# Patient Record
Sex: Female | Born: 1950
Health system: Southern US, Community
[De-identification: ages and names within clinical notes are randomized; demographics above are authoritative.]

## PROBLEM LIST (undated history)

## (undated) DIAGNOSIS — M1990 Unspecified osteoarthritis, unspecified site: Secondary | ICD-10-CM

## (undated) DIAGNOSIS — K219 Gastro-esophageal reflux disease without esophagitis: Secondary | ICD-10-CM

## (undated) DIAGNOSIS — I10 Essential (primary) hypertension: Secondary | ICD-10-CM

## (undated) DIAGNOSIS — T4145XA Adverse effect of unspecified anesthetic, initial encounter: Secondary | ICD-10-CM

## (undated) DIAGNOSIS — T8859XA Other complications of anesthesia, initial encounter: Secondary | ICD-10-CM

## (undated) DIAGNOSIS — K759 Inflammatory liver disease, unspecified: Secondary | ICD-10-CM

## (undated) HISTORY — PX: TUBAL LIGATION: SHX77

## (undated) HISTORY — PX: CHOLECYSTECTOMY: SHX55

---

## 2002-01-30 ENCOUNTER — Ambulatory Visit (HOSPITAL_COMMUNITY): Admission: RE | Admit: 2002-01-30 | Discharge: 2002-01-30 | Payer: Self-pay | Admitting: Internal Medicine

## 2002-03-27 ENCOUNTER — Ambulatory Visit (HOSPITAL_COMMUNITY): Admission: RE | Admit: 2002-03-27 | Discharge: 2002-03-27 | Payer: Self-pay | Admitting: Internal Medicine

## 2002-03-27 HISTORY — PX: COLONOSCOPY: SHX174

## 2008-03-06 ENCOUNTER — Ambulatory Visit: Payer: Self-pay | Admitting: Internal Medicine

## 2008-03-06 LAB — CONVERTED CEMR LAB
ALT: 22 units/L (ref 0–35)
Amylase: 74 units/L (ref 0–105)
CO2: 26 meq/L (ref 19–32)
Chloride: 103 meq/L (ref 96–112)
Hemoglobin: 12.7 g/dL (ref 12.0–15.0)
Lymphs Abs: 1.2 10*3/uL (ref 0.7–4.0)
Monocytes Relative: 9 % (ref 3–12)
Neutro Abs: 5.4 10*3/uL (ref 1.7–7.7)
Neutrophils Relative %: 73 % (ref 43–77)
RBC: 4.28 M/uL (ref 3.87–5.11)
Sodium: 141 meq/L (ref 135–145)
Tissue Transglutaminase Ab, IgA: 0 units (ref <7)
Total Bilirubin: 0.4 mg/dL (ref 0.3–1.2)
Total Protein: 7.2 g/dL (ref 6.0–8.3)
WBC: 7.4 10*3/uL (ref 4.0–10.5)

## 2008-03-29 ENCOUNTER — Telehealth (INDEPENDENT_AMBULATORY_CARE_PROVIDER_SITE_OTHER): Payer: Self-pay

## 2008-04-05 ENCOUNTER — Encounter: Payer: Self-pay | Admitting: Gastroenterology

## 2008-08-09 DIAGNOSIS — I10 Essential (primary) hypertension: Secondary | ICD-10-CM | POA: Insufficient documentation

## 2008-08-09 DIAGNOSIS — K3189 Other diseases of stomach and duodenum: Secondary | ICD-10-CM

## 2008-08-09 DIAGNOSIS — R1013 Epigastric pain: Secondary | ICD-10-CM

## 2008-08-09 DIAGNOSIS — M129 Arthropathy, unspecified: Secondary | ICD-10-CM | POA: Insufficient documentation

## 2010-05-20 NOTE — Assessment & Plan Note (Signed)
NAME:  Lisa French, Lisa French              CHART#:  [id number]   DATE:  03/06/2008                       DOB:  September 23, 1950   CHIEF COMPLAINT:  Indigestion.   HISTORY OF PRESENT ILLNESS:  The patient is a pleasant 60 year old  Caucasian female from [place] followed primarily by Dr. Donzetta Sprung over  there, who is back to see me with ongoing symptoms of intermittent left  upper quadrant abdominal pain, progressive bloating during the day,  intermittent bouts of diarrhea and constipation.  She describes  indigestion rubbing her periumbilical area from time to time.  She  states she is sore in this area on a nearly daily basis.  She has had  these symptoms as long as she can remember, specifies at least 10 years.  She has not had any associated nausea, vomiting, melena or hematochezia.  She has actually gained about 7 pounds since we last saw her in 2004.  She states that she has a lactose intolerance and has diarrhea and  bloating if she consumes lactose-containing foods.  However, about 50%  of the time she is constipated.  These symptoms have been ongoing for  many years.  I saw this nice lady for similar symptoms back in 2004, at  which time she underwent an EGD which revealed a normal upper GI tract.  Ultrasound previously revealed a normal gallbladder.  A HIDA with a CCK  demonstrated a gallbladder EF of 63%.  She tells me that since that time  Dr. Reuel Boom has done an EGD and it has been over 2 years ago, however,  and he found no abnormalities.  She has been on Protonix and another  acid-suppressing agents over the years, without much improvement.  She  currently is taking ranitidine 150 mg orally b.i.d.  She has also been  on a probiotic.  She really denies any reflux symptoms, odynophagia or  dysphagia.  There is no family history of GI neoplasia.  She is not  taking any nonsteroidal agents.   PAST MEDICAL HISTORY:  Significant for hypercholesterolemia, high blood  pressure.   PAST  SURGICAL HISTORY:  Tubal ligation, EGD, colonoscopy.   CURRENT MEDICATIONS:  Ranitidine 150 mg orally b.i.d., probiotic daily,  lisinopril 10 mg daily, lovastatin 20 mg orally daily.   ALLERGIES:  No known drug allergies.   FAMILY HISTORY:  Mother is age 55, alive and in good health.  Father  passed away at age 58 with lung cancer.  One sister with breast cancer.   SOCIAL HISTORY:  The patient is married.  She works in Engineer, petroleum.  No  tobacco.  No alcohol.  No illicit drugs.   REVIEW OF SYSTEMS:  As in history of present illness.  No chest pain, no  dyspnea on exertion.  No fever, chills, night sweats.   PHYSICAL EXAMINATION:  A 57-year lady resting comfortably.  Weight 153,  height 5 feet 5 inches.  Temperature 97.0, BP 104/76, pulse 60.  SKIN:  Warm and dry.  HEENT EXAM:  No scleral icterus.  Conjunctivae are pink.  CHEST:  Lungs are clear to auscultation.  CARDIAC:  Regular rate and rhythm without murmur, gallop or rub.  BREAST EXAM:  Deferred.  ABDOMEN:  Nondistended, positive bowel sounds, soft.  She does have some  localized periumbilical tenderness without mass or organomegaly.  I do  not appreciate a hernia.  EXTREMITIES:  No edema.  RECTAL EXAM:  Good sphincter tone.  Scant brown stool in the rectal  vault.  No mass in the rectal vault.  Hemoccult negative.   IMPRESSION:  The patient is a very pleasant 60 year old lady with  chronic vague left upper quadrant abdominal pain, intermittent vague  periumbilical discomfort, daily abdominal bloating.  She has alternating  constipation and diarrhea.   She has not been helped with a probiotic or acid suppression therapy  previously.  She has had a negative upper gastrointestinal endoscopic  evaluation on at least 2 occasions, negative colonoscopy in 2004.   I suspect the patient has an element of irritable bowel syndrome but all  of her symptoms are not typical with that entity.  Diagnostic  considerations at this point  would include bacterial overgrowth, celiac  disease, non-ulcer dyspepsia or smoldering low-grade pancreatitis.  I  doubt her symptoms are emanating from her gallbladder.   RECOMMENDATIONS:  1. I have asked her just for the time being to stop ranitidine.  I      will give her a 3-week course of Aciphex 20 mg orally daily just to      see if this makes a difference.  I have given her samples through      the office.  2. Will check a serum amylase, lipase, LFTs and a CBC as well as a      celiac antibody panel and then will plan to see this nice lady back      in the office in 1 month.  I told her that she may need further      evaluation.  She may benefit from an empiric trial of antibiotics      versus proceeding with a hydrogen breath test.  Depending on the      results of the above-mentioned studies, will make further      recommendations in the very near future.       Jonathon Bellows, M.D.  Electronically Signed     RMR/MEDQ  D:  03/06/2008  T:  03/06/2008  Job:  [id number]   cc:   Donzetta Sprung

## 2010-05-23 NOTE — H&P (Signed)
NAME:  Lisa French, Lisa French                           ACCOUNT NO.:  [id number]   MEDICAL RECORD NO.:  [id number]                  PATIENT TYPE:   LOCATION:                                       FACILITY:   PHYSICIAN:  R. Roetta Sessions, M.D.              DATE OF BIRTH:  10-Dec-1950   DATE OF ADMISSION:  DATE OF DISCHARGE:                                HISTORY & PHYSICAL   CHIEF COMPLAINT:  Medial left upper quadrant abdominal pain, some reflux  symptoms, need for colorectal cancer screening.   The patient is a 60 year old lady with left upper abdominal pain and some  reflux symptoms not significantly improved with Protonix  40 mg orally daily as well as Lodine.  Prior gallbladder workup including  ultrasound and HIDA scan were both negative.  EGD back on 01/30/02 at [facility] also revealed a normal upper GI tract.  She had not had any  rectal bleeding.  She has a tendency towards to constipation.  She has  unrelenting medial left upper quadrant abdominal  pain.  She has had  postprandial cramps and diarrhea after eating out in restaurants, but  generally speaking is constipated.  She cannot tell whether Protonix is  really  making much difference with the reflux symptoms or not.  Her liver  profile is normal except for slightly elevated alkaline phosphatase.  Amylase was normal.  She has been on Prevacid and Prilosec previously for  her upper abdominal  symptoms without much improvement.  She has had these  symptoms for two years. Again, eating and having a bowel movement does not  alter these symptoms.  Sometimes if she takes her hands and presses her fist  into her subxiphoid area she can get a little relief.  There is not really a  positional component.   PAST MEDICAL HISTORY:  Arthritis, hypertension.   PAST SURGICAL HISTORY:  None aside from EGD.   CURRENT MEDICATIONS:  1. Lodine 400 mg b.i.d.  2. Lisinopril 10 mg daily.  3. Protonix 40 mg orally daily.   ALLERGIES:  No known drug allergies.   FAMILY/SOCIAL HISTORY:  Seen my 01/18/02 note for more information.   REVIEW OF SYSTEMS:  As in history of present illness.  She is down two  pounds from 146 to 144 since 01/19/02.   PHYSICAL EXAMINATION:  GENERAL:  She appears her baseline.  She appears  comfortable.  VITAL SIGNS:  Weight 144, blood pressure 100/70, pulse 68.  SKIN:  Warm and dry.  No jaundice.  CHEST:  Lungs are clear to auscultation.  CARDIAC:  Regular rate and rhythm without murmurs, gallops or rub.  BREASTS:  Deferred.  ABDOMEN:  Nondistended.  Positive bowel sounds.  Soft.  She is interestingly  not tender to deep palpation medial left upper quadrant.  Her left costal  margin is also not tender to palpation.  I do not appreciate a  mass or  organomegaly.  RECTAL:  Deferred until time of colonoscopy.   IMPRESSION:  The patient is a pleasant 60 year old lady with medial left  upper  quadrant abdominal  pain and occasional reflux symptoms/regurgitation  without any significant improvement in any of the above mentioned symptoms  with three different PPIs  over time.  This brings to question to how much,  if any, reflux is contributing to her symptoms.  One would expect clinical  response to nonsteroidal agents if this was musculoskeletal in origin.   The etiology of her symptoms remains poorly defined.   She needs to have a screening colonoscopy.  We will go ahead and do this as  we are in the midst of her GI evaluation.  Discussed colonoscopy with the  patient.  Potential risks, benefits, alternatives and her questions have  been answered.  She is agreeable and will perform colonoscopy in the near  future.  In the interim, I have asked her to bump up her Protonix to 40 mg  orally b.i.d.  Will see how she is doing the day of colonoscopy.  Will give  some consideration for esophageal manometry and pH monitoring while on acid  suppression therapy.  Further recommendations to  follow.                                               Jonathon Bellows, M.D.    RMR/MEDQ  D:  03/02/2002  T:  03/02/2002  Job:  [id number]   cc:   Donzetta Sprung  [address], [address]  [place]  [place] [address]  Fax: [phone]

## 2010-05-23 NOTE — H&P (Signed)
NAME:  Lisa French, Lisa French                         ACCOUNT NO.:  [id number]   MEDICAL RECORD NO.:  [id number]                  PATIENT TYPE:   LOCATION:                                       FACILITY:   PHYSICIAN:  R. Roetta Sessions, M.D.              DATE OF BIRTH:  10-26-1950   DATE OF ADMISSION:  01/19/2002  DATE OF DISCHARGE:                                HISTORY & PHYSICAL   CHIEF COMPLAINT:  Indigestion.   HISTORY OF PRESENT ILLNESS:  The patient is a 60 year old lady followed  primarily by Donzetta Sprung, M.D.  She came to see me on her own to further  evaluate a two or possibly more year history of what she describes as  indigestion.  She describes epigastric and medial left upper quadrant  abdominal discomfort sometimes after she eats.  At other times she feels  like this discomfort comes up into her lower retrosternal area.  She really  denies typical heartburn symptoms.  She has not had any associated  odynophagia or dysphagia.  When she eats a rather large meals, sometimes she  has lower abdominal cramps and diarrhea.  She also belches frequently.  She  tells me that Donzetta Sprung, M.D., performed an EGD two years ago and was  told she only had a hiatal hernia.  She has been on a variety of acid-  suppressing agents, including Prilosec and Prevacid and more recently  Protonix 40 mg daily.  She has been on one of these almost without  interruption over the past one year and really does not feel that any of  these agents significantly changes her symptoms.  She had previously been on  Celebrex, but because of insurance reasons was switched to Lodine, which she  currently takes.  She tells me that the above-mentioned symptoms predate the  use of nonsteroidal medications.  She has not had any melena or rectal  bleeding.  She has not had any weight loss.  No early satiety.  No nausea or  vomiting.  She has had at least a couple of gallbladder ultrasounds which  revealed no right  upper quadrant abnormalities.  The CBC was normal.  LFTs  okay, except for slightly elevated alkaline phosphatase of 115.  The amylase  and lipase have been normal.  She gets frustrated and wants something done  about her symptoms.  Her symptoms may last for several minutes to a couple  of hours.  There is nothing she can really do to abort them.   PAST MEDICAL HISTORY:  1. Arthritis in the left hip.  2. History of hypertension.   PAST SURGICAL HISTORY:  None.   CURRENT MEDICATIONS:  1. Lodine 400 mg orally b.i.d.  2. Protonix 40 mg daily.  3. Lisinopril 10 mg daily   FAMILY HISTORY:  Negative for chronic GI or liver disease.  Her father  succumbed to lung cancer.   SOCIAL  HISTORY:  The patient has been married for 26 years.  She has three  children.  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  No  tobacco or alcohol.   REVIEW OF SYSTEMS:  No chest pain.  No dyspnea.  No fever or chills.  No  change in weight.   PHYSICAL EXAMINATION:  GENERAL APPEARANCE:  A pleasant 60 year old lady  resting comfortably.  WEIGHT:  146 pounds.  HEIGHT:  5 feet 5 inches.  VITAL SIGNS:  Temperature 97.7 degrees, BP 100/60, pulse 76.  SKIN:  Warm and dry.  No jaundice.  No stigmata of chronic liver disease.  HEENT:  No scleral icterus.  Conjunctivae pink.  Oral cavity with no  lesions.  NECK:  JVD is not prominent.  CHEST:  The lungs are clear to auscultation.  CARDIAC:  Regular rate and rhythm without murmur, rub, or gallop.  BREASTS:  Exam is deferred.  ABDOMEN:  Nondistended.  Positive bowel sounds.  Minimal medial left upper  quadrant tenderness to palpation.  No appreciable mass or organomegaly.  EXTREMITIES:  No edema.   IMPRESSION:  The patient is a 60 year old lady with a least a two-year  history of intermittent epigastric and medial left upper quadrant abdominal  pain with associated belching.  Her symptoms do not sound classic for  gastrointestinal reflux and she  has really not had any meaningful response  to a variety of proton pump inhibitors.   Reportedly a gallbladder ultrasound was negative previously.   She has been taking nonsteroidals, although by her report her symptoms  predate the use of nonsteroid agents.  She reportedly had a normal EGD  (aside from a hiatal hernia) two years ago.   Her symptoms follow the background of dyspepsia.  She could have an NSAID-  induced gastropathy.  She could have biliary diskinesia as well contributing  to her symptoms.   RECOMMENDATIONS:  Because she has been taking nonsteroidals for some time, I  think it would be worthwhile to take another look at her upper GI tract.  To  this extent, I have offered the patient a repeat EGD.  We have talked about  the potential risks and alternatives.  She is agreeable.  We will plan to  perform an EGD in the near future.  Depending on those findings, might  consider a HIDA with fatty meal challenge.   As a totally separate issue, I talked to the patient about the importance of  colorectal cancer screening.  She has never had a colonoscopy.  She ought to  consider to having a colonoscopy sometime in the near future.   Further recommendations to follow.                                               Jonathon Bellows, M.D.    RMR/MEDQ  D:  01/19/2002  T:  01/19/2002  Job:  [id number]   cc:   Donzetta Sprung  [address]  [place]  [place] [address]  Fax: [phone]

## 2010-05-23 NOTE — Op Note (Signed)
NAME:  Lisa French, Lisa French                       ACCOUNT NO.:  [id number]   MEDICAL RECORD NO.:  [id number]                   PATIENT TYPE:  AMB   LOCATION:  DAY                                  FACILITY:   PHYSICIAN:  R. Roetta Sessions, M.D.              DATE OF BIRTH:  1950/02/10   DATE OF PROCEDURE:  03/27/2002  DATE OF DISCHARGE:                                 OPERATIVE REPORT   PROCEDURE:  Screening/diagnostic colonoscopy.   INDICATIONS:  The patient is a 60 year old lady who comes for colorectal  cancer screening.  She has had some refractory reflux symptoms now finally  controlled with Protonix 40 mg b.i.d.  Also has some medial left upper  quadrant abdominal pain the etiology of which has not been well defined.  Colonoscopy is now to be done.  This procedure has been discussed with the  patient at length.  The potential risks, benefits and alternatives have been  reviewed. Questions answered.  Please see my March 02, 2002 H&P for more  information.   DESCRIPTION OF PROCEDURE:  Oxygen saturation, blood pressure, pulse and  respiration were monitored throughout the entire procedure.  Conscious  sedation with Versed 5 mg IV and Demerol 50 mg IV in divided doses.  The  instrument was the Olympus videochip colonoscope.   FINDINGS:  Digital rectal exam revealed no abnormalities.   ENDOSCOPIC FINDINGS:  Prep was good.   Rectum:  Examination of the rectal mucosa including retroflex view of the  anal verge revealed only internal hemorrhoids.   Colon:  Colonic mucosa was surveyed from the rectosigmoid junction to the  left, transverse,  right colon to the area of the appendiceal orifice,  ileocecal valve and cecum. These structures were well seen and photographed  for the record.  Colonic mucosa all the way to the cecum appeared normal.  The terminal ileum was intubated at 10 cm and this segment of the GI tract  also appeared normal.  From this level the scope was slowly  withdrawn.  All  previously mentioned mucosal surfaces were again seen and no other  abnormalities were observed.  The patient tolerated the procedure well, was  retracted in endoscopy.   IMPRESSION:  1. Internal hemorrhoids; otherwise normal rectum.  2. Normal colon and terminal ileum.   RECOMMENDATIONS:  1. Routine colonoscopy in 10 years.  2. Continue Protonix 40 mg b.i.d.  3. Office visit in one month.  4.     I am concerned that Lodine may somehow be contributing to some of her     abdominal pain.  If her abdominal is not much improved when she comes     back to see me in one month, then we will consider switching out to     another NSAID, perhaps Coxib and pursuing further diagnostic studies.  Jonathon Bellows, M.D.    RMR/MEDQ  D:  03/27/2002  T:  03/27/2002  Job:  [id number]   cc:   Donzetta Sprung  [address]  [place]  [place] [address]  Fax: [phone]

## 2010-05-23 NOTE — Op Note (Signed)
NAME:  Lisa French, Lisa French                       ACCOUNT NO.:  [id number]   MEDICAL RECORD NO.:  [id number]                   PATIENT TYPE:  AMB   LOCATION:  DAY                                  FACILITY:  [facility]   PHYSICIAN:  R. Roetta Sessions, M.D.              DATE OF BIRTH:  07-29-1950   DATE OF PROCEDURE:  01/30/2002  DATE OF DISCHARGE:                                 OPERATIVE REPORT   PROCEDURE:  Diagnostic EGD.   INDICATIONS FOR PROCEDURE:  The patient is a 60 year old lady with two year  history of intermittent epigastric, medial left upper quadrant abdominal  pain, associated with belching.  Reportedly, she had a negative gallbladder  ultrasound at [facility].  She now tells me today she also had a HIDA  scan which was negative, although I did not have that report for review.  She had been taking nonsteroidals.  She has not derived any significant  relief with multiple PPI's, including more recently, Protonix 40 mg orally  daily.  EGD is now being done further evaluation of her symptoms.  This  approach has been discussed with Lisa French.  The potential risks,  benefits, and alternatives have been reviewed and questions answered.  She  is agreeable.  Please see my dictated H&P for more information from  01/19/02. ASA2.   PROCEDURE NOTE:  O2 saturation, blood pressure, and pulse oximetry were  monitored throughout the entire procedure.  Conscious sedation Versed 4 mg  IV, Demerol 50 mg IV, in divided doses.   INSTRUMENT:  Olympus video gastroscope.   FINDINGS:  Examination of the tubular esophagus revealed no mucosal  abnormalities.  EG junction was easily traversed.   SUBACROMIAL GASTRIC CAVITY:  The subacromial gastric cavity was entered,  insufflated well with air.  Thorough examination of gastric mucosa including  retroflexed view of the proximal stomach and esophagogastric junction  demonstrated no abnormalities.  The pylorus was patent and easily  traversed.   DUODENUM:  The bulb and second portion appeared normal.   THERAPY/DIAGNOSTIC MANEUVERS PERFORMED:  None.   The patient tolerated the procedure well and was reactive in endoscopy.   IMPRESSION:  1. Normal esophagus.  2. Normal stomach.  3. Normal duodenum.  4. No endoscopic explanation for patient's symptoms.    RECOMMENDATIONS:  1. Would like to review the HIDA and ultrasound reportedly done at [facility]     previously.  2. When I initially saw Lisa French, she ought to have a screening     colonoscopy.  3. Further recommendations to follow.                                               Jonathon Bellows, M.D.    RMR/MEDQ  D:  01/30/2002  T:  01/30/2002  Job:  [id number]   cc:   Donzetta Sprung  [address]  [place]  [place] [address]  Fax: [phone]

## 2011-10-07 ENCOUNTER — Other Ambulatory Visit: Payer: Self-pay | Admitting: Orthopedic Surgery

## 2011-10-07 MED ORDER — BUPIVACAINE LIPOSOME 1.3 % IJ SUSP: 20.0000 mL | Freq: Once | INTRAMUSCULAR | Status: DC

## 2011-10-07 MED ORDER — DEXAMETHASONE SODIUM PHOSPHATE 10 MG/ML IJ SOLN: 10.0000 mg | Freq: Once | INTRAMUSCULAR | Status: DC

## 2011-10-07 NOTE — Progress Notes (Signed)
Preoperative surgical orders have been place into the Epic hospital system for Lisa French on 10/07/2011, 4:11 PM  by Patrica Duel for surgery on 10/28/11.  Preop Total Hip orders including Experel Injecion, IV Tylenol, and IV Decadron as long as there are no contraindications to the above medications. Avel Peace, PA-C

## 2011-10-19 ENCOUNTER — Encounter (HOSPITAL_COMMUNITY): Payer: Self-pay | Admitting: Pharmacy Technician

## 2011-10-21 NOTE — Patient Instructions (Signed)
20 Lisa French  10/21/2011   Your procedure is scheduled on:  10/28/11 0715am-0900am  Report to [facility] at 0515 AM.  Call this number if you have problems the morning of surgery: [phone]   Remember:   Do not eat food:After Midnight.  May have clear liquids:until Midnight .  Marland Kitchen  Take these medicines the morning of surgery with A SIP OF WATER:    Do not wear jewelry, make-up or nail polish.  Do not wear lotions, powders, or perfumes. .  Do not shave 48 hours prior to surgery.   Do not bring valuables to the hospital.  Contacts, dentures or bridgework may not be worn into surgery.  Leave suitcase in the car. After surgery it may be brought to your room.  For patients admitted to the hospital, checkout time is 11:00 AM the day of discharge.               SEE CHG INSTRUCTION SHEET    Please read over the following fact sheets that you were given: MRSA Information, coughing and deep breathing exercises, leg exercises , Blood Transfusion Fact Sheet, Incentive Spirometry Fact Sheet

## 2011-10-22 ENCOUNTER — Encounter (HOSPITAL_COMMUNITY)
Admission: RE | Admit: 2011-10-22 | Discharge: 2011-10-22 | Disposition: A | Discharge status: Home / Self Care | Payer: BC Managed Care – PPO | Source: Ambulatory Visit | Attending: Orthopedic Surgery | Admitting: Orthopedic Surgery

## 2011-10-22 ENCOUNTER — Encounter (HOSPITAL_COMMUNITY): Payer: Self-pay

## 2011-10-22 ENCOUNTER — Ambulatory Visit (HOSPITAL_COMMUNITY)
Admission: RE | Admit: 2011-10-22 | Discharge: 2011-10-22 | Disposition: A | Discharge status: Home / Self Care | Payer: BC Managed Care – PPO | Source: Ambulatory Visit | Attending: Orthopedic Surgery | Admitting: Orthopedic Surgery

## 2011-10-22 DIAGNOSIS — Z01810 Encounter for preprocedural cardiovascular examination: Secondary | ICD-10-CM | POA: Insufficient documentation

## 2011-10-22 DIAGNOSIS — J984 Other disorders of lung: Secondary | ICD-10-CM | POA: Insufficient documentation

## 2011-10-22 DIAGNOSIS — Z01812 Encounter for preprocedural laboratory examination: Secondary | ICD-10-CM | POA: Insufficient documentation

## 2011-10-22 DIAGNOSIS — Z01818 Encounter for other preprocedural examination: Secondary | ICD-10-CM | POA: Insufficient documentation

## 2011-10-22 HISTORY — DX: Inflammatory liver disease, unspecified: K75.9

## 2011-10-22 HISTORY — DX: Unspecified osteoarthritis, unspecified site: M19.90

## 2011-10-22 HISTORY — DX: Essential (primary) hypertension: I10

## 2011-10-22 HISTORY — DX: Gastro-esophageal reflux disease without esophagitis: K21.9

## 2011-10-22 LAB — URINALYSIS, ROUTINE W REFLEX MICROSCOPIC
Bilirubin Urine: NEGATIVE
Ketones, ur: NEGATIVE mg/dL
Nitrite: NEGATIVE
pH: 6 (ref 5.0–8.0)

## 2011-10-22 LAB — CBC
HCT: 38.2 % (ref 36.0–46.0)
Hemoglobin: 12.5 g/dL (ref 12.0–15.0)
MCV: 89.9 fL (ref 78.0–100.0)
Platelets: 251 10*3/uL (ref 150–400)
RBC: 4.25 MIL/uL (ref 3.87–5.11)
WBC: 5.7 10*3/uL (ref 4.0–10.5)

## 2011-10-22 LAB — COMPREHENSIVE METABOLIC PANEL
AST: 24 U/L (ref 0–37)
BUN: 14 mg/dL (ref 6–23)
CO2: 28 mEq/L (ref 19–32)
Chloride: 100 mEq/L (ref 96–112)
Creatinine, Ser: 1.1 mg/dL (ref 0.50–1.10)
GFR calc Af Amer: 61 mL/min — ABNORMAL LOW (ref >90)
GFR calc non Af Amer: 53 mL/min — ABNORMAL LOW (ref >90)
Glucose, Bld: 99 mg/dL (ref 70–99)
Total Bilirubin: 0.3 mg/dL (ref 0.3–1.2)

## 2011-10-22 LAB — PROTIME-INR: INR: 0.9 (ref 0.00–1.49)

## 2011-10-22 LAB — URINE MICROSCOPIC-ADD ON

## 2011-10-22 NOTE — Progress Notes (Signed)
Urinalysis with micro results faxed via EPIC to Dr Aluisio.   

## 2011-10-27 ENCOUNTER — Other Ambulatory Visit: Payer: Self-pay | Admitting: Orthopedic Surgery

## 2011-10-27 NOTE — H&P (Signed)
Lisa French  DOB: 12/22/1950 Married / Language: English / Race: White Female  Date of Admission:  10/28/11  Chief Complaint:  Right Hip Pain  History of Present Illness The patient is a 60 year old female who comes in for a preoperative History and Physical. The patient is scheduled for a right total hip arthroplasty to be performed by Dr. Frank V. Aluisio, MD at [facility] on 10/28/2011. The patient is a 60 year old female who presented for follow up of their hip. The patient is being followed for their right hip pain. They are 6 year(s) out from when symptoms began. Symptoms reported today include: pain, pain after sitting, pain at night and stiffness. The patient feels that they are doing poorly and report their pain level to be moderate. Her pain is getting to the stage where she is ready to go ahead and get this hip fixed. She has been fighting this for several years and generally does fairly well under warm weather, but as soon as the cold weather comes, she has a marked increase in pain and dysfunction. She is having a harder time doing activities of daily living now. The hip is definitely limiting what she can and cannot do. She is ready to go ahead and get it fixed. They have been treated conservatively in the past for the above stated problem and despite conservative measures, they continue to have progressive pain and severe functional limitations and dysfunction. They have failed non-operative management. It is felt that they would benefit from undergoing total joint replacement. Risks and benefits of the procedure have been discussed with the patient and they elect to proceed with surgery. There are no active contraindications to surgery such as ongoing infection or rapidly progressive neurological disease.   Problem List Osteoarthritis, Hip (715.35)   Allergies No Known Drug Allergies   Family History Hypertension. sister Osteoarthritis.  mother and grandmother mothers side Cancer. father and sister Cerebrovascular Accident. child Heart Disease. grandmother mothers side and grandfather fathers side   Social History Previously in rehab. no Tobacco / smoke exposure. no Tobacco use. never smoker Most recent primary occupation. Financial Secretary Number of flights of stairs before winded. 4-5 Pain Contract. no Marital status. married Alcohol use. never consumed alcohol Exercise. Exercises monthly; does running / walking Illicit drug use. no Living situation. live with spouse Children. 4 Current work status. working full time Drug/Alcohol Rehab (Currently). no Post-Surgical Plans. Plan is to go home. Advance Directives. Healthcare POA   Medication History Lisinopril ( Oral) Specific dose unknown - Active. Dicyclomine HCl (20MG Tablet, Oral) Active.   Past Surgical History Gallbladder Surgery. laporoscopic Tubal Ligation   Past Medical History Migraine Headache High blood pressure Irritable bowel syndrome   Review of Systems General:Not Present- Chills, Fever, Night Sweats, Fatigue, Weight Gain, Weight Loss and Memory Loss. Skin:Not Present- Hives, Itching, Rash, Eczema and Lesions. HEENT:Not Present- Tinnitus, Headache, Double Vision, Visual Loss, Hearing Loss and Dentures. Respiratory:Not Present- Shortness of breath with exertion, Shortness of breath at rest, Allergies, Coughing up blood and Chronic Cough. Cardiovascular:Not Present- Chest Pain, Racing/skipping heartbeats, Difficulty Breathing Lying Down, Murmur, Swelling and Palpitations. Gastrointestinal:Not Present- Bloody Stool, Heartburn, Abdominal Pain, Vomiting, Nausea, Constipation, Diarrhea, Difficulty Swallowing, Jaundice and Loss of appetitie. Female Genitourinary:Not Present- Blood in Urine, Urinary frequency, Weak urinary stream, Discharge, Flank Pain, Incontinence, Painful Urination, Urgency, Urinary  Retention and Urinating at Night. Musculoskeletal:Present- Morning Stiffness. Not Present- Muscle Weakness, Muscle Pain, Joint Swelling, Joint Pain, Back Pain and   Spasms. Neurological:Not Present- Tremor, Dizziness, Blackout spells, Paralysis, Difficulty with balance and Weakness. Psychiatric:Not Present- Insomnia.   Vitals Weight: 150 lb Height: 66 in Body Surface Area: 1.78 m Body Mass Index: 24.21 kg/m Pulse: 76 (Regular) Resp.: 14 (Unlabored) BP: 118/72 (Sitting, Right Arm, Standard)   Physical Exam The physical exam findings are as follows:  Patient is a 60 year old female with conitnued hip pain.   General Mental Status - Alert, cooperative and good historian. General Appearance- pleasant. Not in acute distress. Orientation- Oriented X3. Build & Nutrition- Well nourished and Well developed.   Head and Neck Head- normocephalic, atraumatic . Neck Global Assessment- supple. no bruit auscultated on the right and no bruit auscultated on the left.   Eye Pupil- Bilateral- Regular and Round. Note: wears glasses Motion- Bilateral- EOMI.   Chest and Lung Exam Auscultation: Breath sounds:- clear at anterior chest wall and - clear at posterior chest wall. Adventitious sounds:- No Adventitious sounds.   Cardiovascular Auscultation:Rhythm- Regular rate and rhythm. Heart Sounds- S1 WNL and S2 WNL. Murmurs & Other Heart Sounds:Auscultation of the heart reveals - No Murmurs.   Abdomen Palpation/Percussion:Tenderness- Abdomen is non-tender to palpation. Rigidity (guarding)- Abdomen is soft. Auscultation:Auscultation of the abdomen reveals - Bowel sounds normal.   Female Genitourinary Not done, not pertinent to present illness  Musculoskeletal Well developed female, alert and oriented in no apparent distress. Left hip has normal range of motion without discomfort. Right hip flexes to about 90. No internal rotation, about 20  external rotation, and about 10 to 20 of abduction. She has pain on the range of motion of that hip. Knee exam is normal. Pulses, sensation, and motor is intact in the right lower extremity. She has an antalgic gait pattern on the right.  RADIOGRAPHS: AP pelvis and lateral of the right hip are reviewed a few months ago and she has protrusio with bone on bone arthritis of the right hip. She has lost some of the medial bone structure and she is almost up against the medial wall of the acetabulum. Left hip has minimal degenerative change.  Assessment & Plan Osteoarthritis, Hip (715.35) Impression: Right Hip  Note: Patient is for a right total hip replacement by Dr. Aluisio.  Plan is to go home.  PCP - Dr. Terry Daniel  Signed electronically by DREW L Sherina Stammer, PA-C 

## 2011-10-28 ENCOUNTER — Ambulatory Visit (HOSPITAL_COMMUNITY): Payer: BC Managed Care – PPO

## 2011-10-28 ENCOUNTER — Encounter (HOSPITAL_COMMUNITY): Payer: Self-pay | Admitting: Anesthesiology

## 2011-10-28 ENCOUNTER — Encounter (HOSPITAL_COMMUNITY)
Admission: RE | Disposition: A | Discharge status: Home Health | Payer: Self-pay | Source: Ambulatory Visit | Attending: Orthopedic Surgery

## 2011-10-28 ENCOUNTER — Encounter (HOSPITAL_COMMUNITY): Payer: Self-pay | Admitting: *Deleted

## 2011-10-28 ENCOUNTER — Inpatient Hospital Stay (HOSPITAL_COMMUNITY)
Admission: RE | Admit: 2011-10-28 | Discharge: 2011-10-30 | DRG: 818 | Disposition: A | Discharge status: Home Health | Payer: BC Managed Care – PPO | Source: Ambulatory Visit | Attending: Orthopedic Surgery | Admitting: Orthopedic Surgery

## 2011-10-28 ENCOUNTER — Ambulatory Visit (HOSPITAL_COMMUNITY): Payer: BC Managed Care – PPO | Admitting: Anesthesiology

## 2011-10-28 DIAGNOSIS — K219 Gastro-esophageal reflux disease without esophagitis: Secondary | ICD-10-CM | POA: Diagnosis present

## 2011-10-28 DIAGNOSIS — I10 Essential (primary) hypertension: Secondary | ICD-10-CM | POA: Diagnosis present

## 2011-10-28 DIAGNOSIS — M1610 Unilateral primary osteoarthritis, unspecified hip: Principal | ICD-10-CM | POA: Diagnosis present

## 2011-10-28 DIAGNOSIS — M169 Osteoarthritis of hip, unspecified: Secondary | ICD-10-CM | POA: Diagnosis present

## 2011-10-28 DIAGNOSIS — D62 Acute posthemorrhagic anemia: Secondary | ICD-10-CM

## 2011-10-28 DIAGNOSIS — Z96649 Presence of unspecified artificial hip joint: Secondary | ICD-10-CM

## 2011-10-28 HISTORY — PX: TOTAL HIP ARTHROPLASTY: SHX124

## 2011-10-28 SURGERY — ARTHROPLASTY, HIP, TOTAL,POSTERIOR APPROACH
Anesthesia: Spinal | Site: Hip | Laterality: Right | Wound class: Clean

## 2011-10-28 MED ORDER — BUPIVACAINE LIPOSOME 1.3 % IJ SUSP
20.0000 mL | Freq: Once | INTRAMUSCULAR | Status: AC
Start: 2011-10-28 — End: 2011-10-28
  Administered 2011-10-28: 70 mL
  Filled 2011-10-28: qty 20

## 2011-10-28 MED ORDER — METOCLOPRAMIDE HCL 5 MG/ML IJ SOLN: 5.0000 mg | Freq: Three times a day (TID) | INTRAMUSCULAR | Status: DC | PRN

## 2011-10-28 MED ORDER — DOCUSATE SODIUM 100 MG PO CAPS
100.0000 mg | ORAL_CAPSULE | Freq: Two times a day (BID) | ORAL | Status: DC
  Administered 2011-10-28 – 2011-10-30 (×5): 100 mg via ORAL

## 2011-10-28 MED ORDER — MORPHINE SULFATE 2 MG/ML IJ SOLN: 1.0000 mg | INTRAMUSCULAR | Status: DC | PRN

## 2011-10-28 MED ORDER — PROMETHAZINE HCL 25 MG/ML IJ SOLN
6.2500 mg | INTRAMUSCULAR | Status: DC | PRN
  Administered 2011-10-28: 6.25 mg via INTRAVENOUS

## 2011-10-28 MED ORDER — KCL IN DEXTROSE-NACL 20-5-0.9 MEQ/L-%-% IV SOLN
INTRAVENOUS | Status: DC
  Administered 2011-10-29: 02:00:00 via INTRAVENOUS
  Filled 2011-10-28 (×3): qty 1000

## 2011-10-28 MED ORDER — SUFENTANIL CITRATE 50 MCG/ML IV SOLN
INTRAVENOUS | Status: DC | PRN
  Administered 2011-10-28: 10 ug via INTRAVENOUS
  Administered 2011-10-28: 20 ug via INTRAVENOUS
  Administered 2011-10-28 (×2): 10 ug via INTRAVENOUS

## 2011-10-28 MED ORDER — HYDROMORPHONE HCL PF 1 MG/ML IJ SOLN
INTRAMUSCULAR | Status: AC
  Filled 2011-10-28: qty 1

## 2011-10-28 MED ORDER — TRAMADOL HCL 50 MG PO TABS: 50.0000 mg | ORAL_TABLET | Freq: Four times a day (QID) | ORAL | Status: DC | PRN

## 2011-10-28 MED ORDER — ALBUTEROL SULFATE (5 MG/ML) 0.5% IN NEBU
2.5000 mg | INHALATION_SOLUTION | Freq: Once | RESPIRATORY_TRACT | Status: AC
  Administered 2011-10-28: 2.5 mg via RESPIRATORY_TRACT
  Filled 2011-10-28: qty 0.5

## 2011-10-28 MED ORDER — DEXAMETHASONE SODIUM PHOSPHATE 10 MG/ML IJ SOLN
10.0000 mg | Freq: Once | INTRAMUSCULAR | Status: AC
  Administered 2011-10-29: 10 mg via INTRAVENOUS
  Filled 2011-10-28: qty 1

## 2011-10-28 MED ORDER — CHLORHEXIDINE GLUCONATE 4 % EX LIQD
60.0000 mL | Freq: Once | CUTANEOUS | Status: DC
  Filled 2011-10-28: qty 60

## 2011-10-28 MED ORDER — ACETAMINOPHEN 10 MG/ML IV SOLN
INTRAVENOUS | Status: DC | PRN
  Administered 2011-10-28: 1000 mg via INTRAVENOUS

## 2011-10-28 MED ORDER — PHENOL 1.4 % MT LIQD
1.0000 | OROMUCOSAL | Status: DC | PRN
  Filled 2011-10-28: qty 177

## 2011-10-28 MED ORDER — CEFAZOLIN SODIUM 1-5 GM-% IV SOLN
1.0000 g | Freq: Four times a day (QID) | INTRAVENOUS | Status: AC
  Administered 2011-10-28 (×2): 1 g via INTRAVENOUS
  Filled 2011-10-28 (×2): qty 50

## 2011-10-28 MED ORDER — PROPOFOL 10 MG/ML IV BOLUS
INTRAVENOUS | Status: DC | PRN
  Administered 2011-10-28: 150 mg via INTRAVENOUS

## 2011-10-28 MED ORDER — CEFAZOLIN SODIUM-DEXTROSE 2-3 GM-% IV SOLR
2.0000 g | INTRAVENOUS | Status: AC
  Administered 2011-10-28: 2 g via INTRAVENOUS

## 2011-10-28 MED ORDER — MORPHINE SULFATE 2 MG/ML IJ SOLN
INTRAMUSCULAR | Status: AC
  Administered 2011-10-28: 1 mg via INTRAVENOUS
  Filled 2011-10-28: qty 1

## 2011-10-28 MED ORDER — PROMETHAZINE HCL 25 MG/ML IJ SOLN
INTRAMUSCULAR | Status: AC
  Filled 2011-10-28: qty 1

## 2011-10-28 MED ORDER — BISACODYL 10 MG RE SUPP: 10.0000 mg | Freq: Every day | RECTAL | Status: DC | PRN

## 2011-10-28 MED ORDER — ACETAMINOPHEN 325 MG PO TABS: 650.0000 mg | ORAL_TABLET | Freq: Four times a day (QID) | ORAL | Status: DC | PRN

## 2011-10-28 MED ORDER — ONDANSETRON HCL 4 MG/2ML IJ SOLN
INTRAMUSCULAR | Status: DC | PRN
  Administered 2011-10-28: 4 mg via INTRAVENOUS

## 2011-10-28 MED ORDER — SODIUM CHLORIDE 0.9 % IV SOLN: INTRAVENOUS | Status: DC

## 2011-10-28 MED ORDER — STERILE WATER FOR IRRIGATION IR SOLN
Status: DC | PRN
  Administered 2011-10-28: 3000 mL

## 2011-10-28 MED ORDER — FLEET ENEMA 7-19 GM/118ML RE ENEM: 1.0000 | ENEMA | Freq: Once | RECTAL | Status: AC | PRN

## 2011-10-28 MED ORDER — HYDROMORPHONE HCL PF 1 MG/ML IJ SOLN
0.2500 mg | INTRAMUSCULAR | Status: DC | PRN
  Administered 2011-10-28 (×4): 0.5 mg via INTRAVENOUS

## 2011-10-28 MED ORDER — OXYCODONE HCL 5 MG PO TABS
5.0000 mg | ORAL_TABLET | ORAL | Status: DC | PRN
  Administered 2011-10-28 (×3): 5 mg via ORAL
  Administered 2011-10-29: 10 mg via ORAL
  Administered 2011-10-29 – 2011-10-30 (×8): 5 mg via ORAL
  Filled 2011-10-28 (×11): qty 1
  Filled 2011-10-28: qty 2
  Filled 2011-10-28: qty 1

## 2011-10-28 MED ORDER — GLYCOPYRROLATE 0.2 MG/ML IJ SOLN
INTRAMUSCULAR | Status: DC | PRN
  Administered 2011-10-28: .4 mg via INTRAVENOUS

## 2011-10-28 MED ORDER — POLYETHYLENE GLYCOL 3350 17 G PO PACK: 17.0000 g | PACK | Freq: Every day | ORAL | Status: DC | PRN

## 2011-10-28 MED ORDER — LACTATED RINGERS IV SOLN
INTRAVENOUS | Status: DC | PRN
  Administered 2011-10-28 (×2): via INTRAVENOUS

## 2011-10-28 MED ORDER — EPHEDRINE SULFATE 50 MG/ML IJ SOLN
INTRAMUSCULAR | Status: DC | PRN
  Administered 2011-10-28 (×2): 10 mg via INTRAVENOUS

## 2011-10-28 MED ORDER — CEFAZOLIN SODIUM-DEXTROSE 2-3 GM-% IV SOLR
INTRAVENOUS | Status: AC
  Filled 2011-10-28: qty 50

## 2011-10-28 MED ORDER — KETAMINE HCL 10 MG/ML IJ SOLN
INTRAMUSCULAR | Status: DC | PRN
  Administered 2011-10-28: 10 mg via INTRAVENOUS
  Administered 2011-10-28: 20 mg via INTRAVENOUS

## 2011-10-28 MED ORDER — DEXAMETHASONE 6 MG PO TABS
10.0000 mg | ORAL_TABLET | Freq: Once | ORAL | Status: AC
  Filled 2011-10-28: qty 1

## 2011-10-28 MED ORDER — ACETAMINOPHEN 10 MG/ML IV SOLN
INTRAVENOUS | Status: AC
  Filled 2011-10-28: qty 100

## 2011-10-28 MED ORDER — NORTRIPTYLINE HCL 25 MG PO CAPS
25.0000 mg | ORAL_CAPSULE | Freq: Every day | ORAL | Status: DC
  Administered 2011-10-28 – 2011-10-30 (×2): 25 mg via ORAL
  Filled 2011-10-28 (×3): qty 1

## 2011-10-28 MED ORDER — TEMAZEPAM 15 MG PO CAPS: 15.0000 mg | ORAL_CAPSULE | Freq: Every evening | ORAL | Status: DC | PRN

## 2011-10-28 MED ORDER — MIDAZOLAM HCL 5 MG/5ML IJ SOLN
INTRAMUSCULAR | Status: DC | PRN
  Administered 2011-10-28: 2 mg via INTRAVENOUS

## 2011-10-28 MED ORDER — METHOCARBAMOL 100 MG/ML IJ SOLN
500.0000 mg | Freq: Four times a day (QID) | INTRAVENOUS | Status: DC | PRN
  Administered 2011-10-28: 500 mg via INTRAVENOUS
  Filled 2011-10-28: qty 5

## 2011-10-28 MED ORDER — DEXAMETHASONE SODIUM PHOSPHATE 10 MG/ML IJ SOLN
INTRAMUSCULAR | Status: DC | PRN
  Administered 2011-10-28: 8 mg via INTRAVENOUS

## 2011-10-28 MED ORDER — ACETAMINOPHEN 10 MG/ML IV SOLN: 1000.0000 mg | Freq: Once | INTRAVENOUS | Status: DC

## 2011-10-28 MED ORDER — CISATRACURIUM BESYLATE (PF) 10 MG/5ML IV SOLN
INTRAVENOUS | Status: DC | PRN
  Administered 2011-10-28: 10 mg via INTRAVENOUS

## 2011-10-28 MED ORDER — LACTATED RINGERS IV SOLN: INTRAVENOUS | Status: DC

## 2011-10-28 MED ORDER — LIDOCAINE HCL (CARDIAC) 20 MG/ML IV SOLN
INTRAVENOUS | Status: DC | PRN
  Administered 2011-10-28: 100 mg via INTRAVENOUS

## 2011-10-28 MED ORDER — OXYCODONE HCL 5 MG PO TABS: 5.0000 mg | ORAL_TABLET | Freq: Once | ORAL | Status: DC | PRN

## 2011-10-28 MED ORDER — 0.9 % SODIUM CHLORIDE (POUR BTL) OPTIME
TOPICAL | Status: DC | PRN
  Administered 2011-10-28: 1000 mL

## 2011-10-28 MED ORDER — ACETAMINOPHEN 650 MG RE SUPP: 650.0000 mg | Freq: Four times a day (QID) | RECTAL | Status: DC | PRN

## 2011-10-28 MED ORDER — OXYCODONE HCL 5 MG/5ML PO SOLN
5.0000 mg | Freq: Once | ORAL | Status: DC | PRN
  Filled 2011-10-28: qty 5

## 2011-10-28 MED ORDER — RIVAROXABAN 10 MG PO TABS
10.0000 mg | ORAL_TABLET | Freq: Every day | ORAL | Status: DC
  Administered 2011-10-29 – 2011-10-30 (×2): 10 mg via ORAL
  Filled 2011-10-28 (×3): qty 1

## 2011-10-28 MED ORDER — DIPHENHYDRAMINE HCL 12.5 MG/5ML PO ELIX: 12.5000 mg | ORAL_SOLUTION | ORAL | Status: DC | PRN

## 2011-10-28 MED ORDER — KCL IN DEXTROSE-NACL 20-5-0.9 MEQ/L-%-% IV SOLN
INTRAVENOUS | Status: AC
  Filled 2011-10-28: qty 1000

## 2011-10-28 MED ORDER — NEOSTIGMINE METHYLSULFATE 1 MG/ML IJ SOLN
INTRAMUSCULAR | Status: DC | PRN
  Administered 2011-10-28: 3 mg via INTRAVENOUS

## 2011-10-28 MED ORDER — ONDANSETRON HCL 4 MG/2ML IJ SOLN: 4.0000 mg | Freq: Four times a day (QID) | INTRAMUSCULAR | Status: DC | PRN

## 2011-10-28 MED ORDER — METHOCARBAMOL 500 MG PO TABS
500.0000 mg | ORAL_TABLET | Freq: Four times a day (QID) | ORAL | Status: DC | PRN
  Administered 2011-10-28 – 2011-10-29 (×2): 500 mg via ORAL
  Filled 2011-10-28 (×2): qty 1

## 2011-10-28 MED ORDER — ACETAMINOPHEN 10 MG/ML IV SOLN
1000.0000 mg | Freq: Four times a day (QID) | INTRAVENOUS | Status: AC
  Administered 2011-10-28 – 2011-10-29 (×4): 1000 mg via INTRAVENOUS
  Filled 2011-10-28 (×6): qty 100

## 2011-10-28 MED ORDER — MEPERIDINE HCL 50 MG/ML IJ SOLN: 6.2500 mg | INTRAMUSCULAR | Status: DC | PRN

## 2011-10-28 MED ORDER — ONDANSETRON HCL 4 MG PO TABS: 4.0000 mg | ORAL_TABLET | Freq: Four times a day (QID) | ORAL | Status: DC | PRN

## 2011-10-28 MED ORDER — METOCLOPRAMIDE HCL 10 MG PO TABS: 5.0000 mg | ORAL_TABLET | Freq: Three times a day (TID) | ORAL | Status: DC | PRN

## 2011-10-28 MED ORDER — ALBUTEROL SULFATE (5 MG/ML) 0.5% IN NEBU
INHALATION_SOLUTION | RESPIRATORY_TRACT | Status: AC
  Administered 2011-10-28: 2.5 mg
  Filled 2011-10-28: qty 0.5

## 2011-10-28 MED ORDER — MENTHOL 3 MG MT LOZG
1.0000 | LOZENGE | OROMUCOSAL | Status: DC | PRN
  Filled 2011-10-28: qty 9

## 2011-10-28 SURGICAL SUPPLY — 51 items
BAG ZIPLOCK 12X15 (MISCELLANEOUS) ×2 IMPLANT
BIT DRILL 2.8X128 (BIT) ×2 IMPLANT
BLADE EXTENDED COATED 6.5IN (ELECTRODE) ×2 IMPLANT
BLADE SAW SAG 73X25 THK (BLADE) ×1
BLADE SAW SGTL 73X25 THK (BLADE) ×1 IMPLANT
CLOTH BEACON ORANGE TIMEOUT ST (SAFETY) ×2 IMPLANT
DECANTER SPIKE VIAL GLASS SM (MISCELLANEOUS) ×2 IMPLANT
DRAPE INCISE IOBAN 66X45 STRL (DRAPES) ×2 IMPLANT
DRAPE ORTHO SPLIT 77X108 STRL (DRAPES) ×2
DRAPE POUCH INSTRU U-SHP 10X18 (DRAPES) ×2 IMPLANT
DRAPE SURG ORHT 6 SPLT 77X108 (DRAPES) ×2 IMPLANT
DRAPE U-SHAPE 47X51 STRL (DRAPES) ×2 IMPLANT
DRSG ADAPTIC 3X8 NADH LF (GAUZE/BANDAGES/DRESSINGS) ×2 IMPLANT
DRSG MEPILEX BORDER 4X12 (GAUZE/BANDAGES/DRESSINGS) ×2 IMPLANT
DRSG MEPILEX BORDER 4X4 (GAUZE/BANDAGES/DRESSINGS) ×2 IMPLANT
DRSG MEPILEX BORDER 4X8 (GAUZE/BANDAGES/DRESSINGS) ×2 IMPLANT
DURAPREP 26ML APPLICATOR (WOUND CARE) ×4 IMPLANT
ELECT REM PT RETURN 9FT ADLT (ELECTROSURGICAL) ×2
ELECTRODE REM PT RTRN 9FT ADLT (ELECTROSURGICAL) ×1 IMPLANT
ELIMINATOR HOLE APEX DEPUY (Hips) IMPLANT
EVACUATOR 1/8 PVC DRAIN (DRAIN) ×2 IMPLANT
FACESHIELD LNG OPTICON STERILE (SAFETY) ×8 IMPLANT
GLOVE BIO SURGEON STRL SZ8 (GLOVE) ×2 IMPLANT
GLOVE BIOGEL PI IND STRL 8 (GLOVE) ×2 IMPLANT
GLOVE BIOGEL PI INDICATOR 8 (GLOVE) ×2
GLOVE ECLIPSE 8.0 STRL XLNG CF (GLOVE) ×2 IMPLANT
GLOVE SURG SS PI 6.5 STRL IVOR (GLOVE) ×4 IMPLANT
GOWN STRL NON-REIN LRG LVL3 (GOWN DISPOSABLE) ×4 IMPLANT
GOWN STRL REIN XL XLG (GOWN DISPOSABLE) ×4 IMPLANT
IMMOBILIZER KNEE 20 (SOFTGOODS) ×2
IMMOBILIZER KNEE 20 THIGH 36 (SOFTGOODS) ×1 IMPLANT
KIT BASIN OR (CUSTOM PROCEDURE TRAY) ×2 IMPLANT
MANIFOLD NEPTUNE II (INSTRUMENTS) ×2 IMPLANT
NDL SAFETY ECLIPSE 18X1.5 (NEEDLE) ×1 IMPLANT
NEEDLE HYPO 18GX1.5 SHARP (NEEDLE) ×1
NS IRRIG 1000ML POUR BTL (IV SOLUTION) IMPLANT
PACK TOTAL JOINT (CUSTOM PROCEDURE TRAY) ×2 IMPLANT
PASSER SUT SWANSON 36MM LOOP (INSTRUMENTS) ×2 IMPLANT
POSITIONER SURGICAL ARM (MISCELLANEOUS) ×4 IMPLANT
SPONGE GAUZE 4X4 12PLY (GAUZE/BANDAGES/DRESSINGS) ×2 IMPLANT
STRIP CLOSURE SKIN 1/2X4 (GAUZE/BANDAGES/DRESSINGS) ×4 IMPLANT
SUT ETHIBOND NAB CT1 #1 30IN (SUTURE) ×4 IMPLANT
SUT MNCRL AB 4-0 PS2 18 (SUTURE) ×2 IMPLANT
SUT VIC AB 2-0 CT1 27 (SUTURE) ×3
SUT VIC AB 2-0 CT1 TAPERPNT 27 (SUTURE) ×3 IMPLANT
SUT VLOC 180 0 24IN GS25 (SUTURE) ×4 IMPLANT
SYR 50ML LL SCALE MARK (SYRINGE) ×2 IMPLANT
TOWEL OR 17X26 10 PK STRL BLUE (TOWEL DISPOSABLE) ×4 IMPLANT
TOWEL OR NON WOVEN STRL DISP B (DISPOSABLE) ×2 IMPLANT
TRAY FOLEY CATH 14FRSI W/METER (CATHETERS) ×2 IMPLANT
WATER STERILE IRR 1500ML POUR (IV SOLUTION) IMPLANT

## 2011-10-28 NOTE — Anesthesia Procedure Notes (Signed)
Procedure Name: Intubation Date/Time: 10/28/2011 7:28 AM Performed by: Leroy Libman L Patient Re-evaluated:Patient Re-evaluated prior to inductionOxygen Delivery Method: Circle system utilized Preoxygenation: Pre-oxygenation with 100% oxygen Intubation Type: IV induction Ventilation: Mask ventilation without difficulty and Oral airway inserted - appropriate to patient size Laryngoscope Size: Hyacinth Meeker and 2 Grade View: Grade I Tube type: Oral Tube size: 7.5 mm Number of attempts: 1 Airway Equipment and Method: Stylet Placement Confirmation: ETT inserted through vocal cords under direct vision,  breath sounds checked- equal and bilateral and positive ETCO2 Secured at: 21 cm Tube secured with: Tape Dental Injury: Teeth and Oropharynx as per pre-operative assessment

## 2011-10-28 NOTE — Progress Notes (Signed)
O2 mask changed to cannula at 2 L; insp. Stridor much less, SAO2 remains 100%, resp 12, ice chips offered po, pain meds continuing

## 2011-10-28 NOTE — Anesthesia Postprocedure Evaluation (Signed)
Anesthesia Post Note  Patient: Lisa French  Procedure(s) Performed: Procedure(s) (LRB): TOTAL HIP ARTHROPLASTY (Right)  Anesthesia type: General  Patient location: PACU  Post pain: Pain level controlled  Post assessment: Post-op Vital signs reviewed  Last Vitals: BP 125/73  Pulse 87  Temp 36.4 C (Oral)  Resp 17  SpO2 100%  Post vital signs: Reviewed  Level of consciousness: sedated  Complications: No apparent anesthesia complications

## 2011-10-28 NOTE — Preoperative (Signed)
Beta Blockers   Reason not to administer Beta Blockers:Not Applicable 

## 2011-10-28 NOTE — Anesthesia Preprocedure Evaluation (Addendum)
Anesthesia Evaluation  Patient identified by MRN, date of birth, ID band Patient awake    Reviewed: Allergy & Precautions, H&P , NPO status , Patient's Chart, lab work & pertinent test results  Airway Mallampati: I TM Distance: >3 FB Neck ROM: Full    Dental  (+) Teeth Intact and Dental Advisory Given   Pulmonary neg pulmonary ROS,  breath sounds clear to auscultation  Pulmonary exam normal       Cardiovascular hypertension, Pt. on medications Rhythm:Regular Rate:Normal     Neuro/Psych negative neurological ROS  negative psych ROS   GI/Hepatic GERD-  Medicated,(+) Hepatitis -, Unspecified  Endo/Other  negative endocrine ROS  Renal/GU negative Renal ROS     Musculoskeletal   Abdominal   Peds  Hematology negative hematology ROS (+)   Anesthesia Other Findings   Reproductive/Obstetrics                          Anesthesia Physical Anesthesia Plan  ASA: II  Anesthesia Plan: General   Post-op Pain Management:    Induction: Intravenous  Airway Management Planned: LMA  Additional Equipment:   Intra-op Plan:   Post-operative Plan: Extubation in OR  Informed Consent: I have reviewed the patients History and Physical, chart, labs and discussed the procedure including the risks, benefits and alternatives for the proposed anesthesia with the patient or authorized representative who has indicated his/her understanding and acceptance.   Dental advisory given  Plan Discussed with: CRNA  Anesthesia Plan Comments:        Anesthesia Quick Evaluation

## 2011-10-28 NOTE — Progress Notes (Signed)
OK per Dr. Renold Don to go to room; SAO2 98-100 on 2 liters O2, stridor has resolved,

## 2011-10-28 NOTE — H&P (View-Only) (Signed)
Lisa French  DOB: [birth year] Married / Language: English / Race: White Female  Date of Admission:  10/28/11  Chief Complaint:  Right Hip Pain  History of Present Illness The patient is a 61 year old female who comes in for a preoperative History and Physical. The patient is scheduled for a right total hip arthroplasty to be performed by Dr. Gus Rankin. Aluisio, MD at [facility] on 10/28/2011. The patient is a 61 year old female who presented for follow up of their hip. The patient is being followed for their right hip pain. They are 6 year(s) out from when symptoms began. Symptoms reported today include: pain, pain after sitting, pain at night and stiffness. The patient feels that they are doing poorly and report their pain level to be moderate. Her pain is getting to the stage where she is ready to go ahead and get this hip fixed. She has been fighting this for several years and generally does fairly well under warm weather, but as soon as the cold weather comes, she has a marked increase in pain and dysfunction. She is having a harder time doing activities of daily living now. The hip is definitely limiting what she can and cannot do. She is ready to go ahead and get it fixed. They have been treated conservatively in the past for the above stated problem and despite conservative measures, they continue to have progressive pain and severe functional limitations and dysfunction. They have failed non-operative management. It is felt that they would benefit from undergoing total joint replacement. Risks and benefits of the procedure have been discussed with the patient and they elect to proceed with surgery. There are no active contraindications to surgery such as ongoing infection or rapidly progressive neurological disease.   Problem List Osteoarthritis, Hip (715.35)   Allergies No Known Drug Allergies   Family History Hypertension. sister Osteoarthritis.  mother and grandmother mothers side Cancer. father and sister Cerebrovascular Accident. child Heart Disease. grandmother mothers side and grandfather fathers side   Social History Previously in rehab. no Tobacco / smoke exposure. no Tobacco use. never smoker Most recent primary occupation. Presenter, broadcasting Number of flights of stairs before winded. 4-5 Pain Contract. no Marital status. married Alcohol use. never consumed alcohol Exercise. Exercises monthly; does running / walking Illicit drug use. no Living situation. live with spouse Children. 4 Current work status. working full time Drug/Alcohol Rehab (Currently). no Post-Surgical Plans. Plan is to go home. Advance Directives. Healthcare POA   Medication History Lisinopril ( Oral) Specific dose unknown - Active. Dicyclomine HCl (20MG  Tablet, Oral) Active.   Past Surgical History Gallbladder Surgery. laporoscopic Tubal Ligation   Past Medical History Migraine Headache High blood pressure Irritable bowel syndrome   Review of Systems General:Not Present- Chills, Fever, Night Sweats, Fatigue, Weight Gain, Weight Loss and Memory Loss. Skin:Not Present- Hives, Itching, Rash, Eczema and Lesions. HEENT:Not Present- Tinnitus, Headache, Double Vision, Visual Loss, Hearing Loss and Dentures. Respiratory:Not Present- Shortness of breath with exertion, Shortness of breath at rest, Allergies, Coughing up blood and Chronic Cough. Cardiovascular:Not Present- Chest Pain, Racing/skipping heartbeats, Difficulty Breathing Lying Down, Murmur, Swelling and Palpitations. Gastrointestinal:Not Present- Bloody Stool, Heartburn, Abdominal Pain, Vomiting, Nausea, Constipation, Diarrhea, Difficulty Swallowing, Jaundice and Loss of appetitie. Female Genitourinary:Not Present- Blood in Urine, Urinary frequency, Weak urinary stream, Discharge, Flank Pain, Incontinence, Painful Urination, Urgency, Urinary  Retention and Urinating at Night. Musculoskeletal:Present- Morning Stiffness. Not Present- Muscle Weakness, Muscle Pain, Joint Swelling, Joint Pain, Back Pain and  Spasms. Neurological:Not Present- Tremor, Dizziness, Blackout spells, Paralysis, Difficulty with balance and Weakness. Psychiatric:Not Present- Insomnia.   Vitals Weight: 150 lb Height: 66 in Body Surface Area: 1.78 m Body Mass Index: 24.21 kg/m Pulse: 76 (Regular) Resp.: 14 (Unlabored) BP: 118/72 (Sitting, Right Arm, Standard)   Physical Exam The physical exam findings are as follows:  Patient is a 61 year old female with conitnued hip pain.   General Mental Status - Alert, cooperative and good historian. General Appearance- pleasant. Not in acute distress. Orientation- Oriented X3. Build & Nutrition- Well nourished and Well developed.   Head and Neck Head- normocephalic, atraumatic . Neck Global Assessment- supple. no bruit auscultated on the right and no bruit auscultated on the left.   Eye Pupil- Bilateral- Regular and Round. Note: wears glasses Motion- Bilateral- EOMI.   Chest and Lung Exam Auscultation: Breath sounds:- clear at anterior chest wall and - clear at posterior chest wall. Adventitious sounds:- No Adventitious sounds.   Cardiovascular Auscultation:Rhythm- Regular rate and rhythm. Heart Sounds- S1 WNL and S2 WNL. Murmurs & Other Heart Sounds:Auscultation of the heart reveals - No Murmurs.   Abdomen Palpation/Percussion:Tenderness- Abdomen is non-tender to palpation. Rigidity (guarding)- Abdomen is soft. Auscultation:Auscultation of the abdomen reveals - Bowel sounds normal.   Female Genitourinary Not done, not pertinent to present illness  Musculoskeletal Well developed female, alert and oriented in no apparent distress. Left hip has normal range of motion without discomfort. Right hip flexes to about 90. No internal rotation, about 20  external rotation, and about 10 to 20 of abduction. She has pain on the range of motion of that hip. Knee exam is normal. Pulses, sensation, and motor is intact in the right lower extremity. She has an antalgic gait pattern on the right.  RADIOGRAPHS: AP pelvis and lateral of the right hip are reviewed a few months ago and she has protrusio with bone on bone arthritis of the right hip. She has lost some of the medial bone structure and she is almost up against the medial wall of the acetabulum. Left hip has minimal degenerative change.  Assessment & Plan Osteoarthritis, Hip (715.35) Impression: Right Hip  Note: Patient is for a right total hip replacement by Dr. Lequita Halt.  Plan is to go home.  PCP - Dr. Donzetta Sprung  Signed electronically by Roberts Gaudy, PA-C

## 2011-10-28 NOTE — Progress Notes (Signed)
Pt c/o difficulty breathing; SAO2100, resp 12, Dr. Renold Don in to check pt; lungs clear, pt beginning to have inspiratory stridor, ventolin treatment ordered and done

## 2011-10-28 NOTE — Transfer of Care (Signed)
Immediate Anesthesia Transfer of Care Note  Patient: Lisa French  Procedure(s) Performed: Procedure(s) (LRB) with comments: TOTAL HIP ARTHROPLASTY (Right)  Patient Location: PACU  Anesthesia Type: General  Level of Consciousness: awake and oriented  Airway & Oxygen Therapy: Patient Spontanous Breathing and Patient connected to face mask oxygen  Post-op Assessment: Report given to PACU RN and Post -op Vital signs reviewed and stable  Post vital signs: Reviewed and stable  Complications: No apparent anesthesia complications

## 2011-10-28 NOTE — Progress Notes (Signed)
Utilization review completed.  

## 2011-10-28 NOTE — Interval H&P Note (Signed)
History and Physical Interval Note:  10/28/2011 7:17 AM  Lisa French  has presented today for surgery, with the diagnosis of Osteoarthritis of the Right Hip  The various methods of treatment have been discussed with the patient and family. After consideration of risks, benefits and other options for treatment, the patient has consented to  Procedure(s) (LRB) with comments: TOTAL HIP ARTHROPLASTY (Right) as a surgical intervention .  The patient's history has been reviewed, patient examined, no change in status, stable for surgery.  I have reviewed the patient's chart and labs.  Questions were answered to the patient's satisfaction.     Loanne Drilling

## 2011-10-28 NOTE — Op Note (Signed)
Pre-operative diagnosis- Osteoarthritis Right hip  Post-operative diagnosis- Osteoarthritis  Right hip  Procedure-  RightTotal Hip Arthroplasty  Surgeon- Gus Rankin. Wallace Cogliano, MD  Assistant- Leilani Able, PA-C   Anesthesia  General  EBL- 200   Drain Hemovac   Complication- None  Condition-PACU - hemodynamically stable.   Brief Clinical Note-  Lisa French is a 61 y.o. female with end stage arthritis of her right hip with progressively worsening pain and dysfunction. Pain occurs with activity and rest including pain at night. She has tried analgesics, protected weight bearing and rest without benefit. Her functional problems secondary to the hip are even worse than her pain.. Radiographs demonstrate bone on bone arthritis with subchondral cyst formation. She presents now for right THA.  Procedure in detail-   The patient is brought into the operating room and placed on the operating table. After successful administration of General  anesthesia, the patient is placed in the  Left lateral decubitus position with the  Right side up and held in place with the hip positioner. The lower extremity is isolated from the perineum with plastic drapes and time-out is performed by the surgical team. The lower extremity is then prepped and draped in the usual sterile fashion. A short posterolateral incision is made with a ten blade through the subcutaneous tissue to the level of the fascia lata which is incised in line with the skin incision. The sciatic nerve is palpated and protected and the short external rotators and capsule are isolated from the femur. The hip is then dislocated and the center of the femoral head is marked. A trial prosthesis is placed such that the trial head corresponds to the center of the patients' native femoral head. The resection level is marked on the femoral neck and the resection is made with an oscillating saw. The femoral head is removed and femoral retractors placed to  gain access to the femoral canal.      The canal finder is passed into the femoral canal and the canal is thoroughly irrigated with sterile saline to remove the fatty contents. Axial reaming is performed to 11.5  mm, proximal reaming to 16D  and the sleeve machined to a large. A 16 D large trial sleeve is placed into the proximal femur.      The femur is then retracted anteriorly to gain acetabular exposure. Acetabular retractors are placed and the labrum and osteophytes are removed, Acetabular reaming is performed to 51  mm and a 52  mm Pinnacle acetabular shell is placed in anatomic position with excellent purchase. Additional dome screws were placed. An apex hole eliminator is placed and the permanent 32 mm neutral + 4 Marathon liner is placed into the acetabular shell.      The trial femur is then placed into the femoral canal. The size is 16 x 11  stem with a 36 + 6  neck and a 32 + 0 head with the neck version 10 degrees beyond  the patients' native anteversion. The hip is reduced with excellent stability with full extension and full external rotation, 70 degrees flexion with 40 degrees adduction and 90 degrees internal rotation and 90 degrees of flexion with 70 degrees of internal rotation. The operative leg is placed on top of the non-operative leg and the leg lengths are found to be equal. The trials are then removed and the permanent implant of the same size is impacted into the femoral canal. The ceramic femoral head of the same size as  the trial is placed and the hip is reduced with the same stability parameters. The operative leg is again placed on top of the non-operative leg and the leg lengths are found to be equal.      The wound is then copiously irrigated with saline solution and the capsule and short external rotators are re-attached to the femur through drill holes with Ethibond suture. The fascia lata is closed over a hemovac drain with #1 vicryl suture and the fascia lata, gluteal muscles  and subcutaneous tissues are injected with Exparel 20ml diluted with saline 50ml. The subcutaneous tissues are closed with #1 and2-0 vicryl and the subcuticular layer closed with running 4-0 Monocryl. The drain is hooked to suction, incision cleaned and dried, and steri-srips and a bulky sterile dressing applied. The limb is placed into a knee immobilizer and the patient is awakened and transported to recovery in stable condition.      Please note that a surgical assistant was a medical necessity for this procedure in order to perform it in a safe and expeditious manner. The assistant was necessary to provide retraction to the vital neurovascular structures and to retract and position the limb to allow for anatomic placement of the prosthetic components.  Gus Rankin Lisa Kooi, MD    10/28/2011, 8:33 AM

## 2011-10-28 NOTE — Evaluation (Signed)
Physical Therapy Evaluation Patient Details Name: Lisa French MRN: [id number] DOB: October 01, 1950 Today's Date: 10/28/2011 Time: 8295-6213 PT Time Calculation (min): 30 min  PT Assessment / Plan / Recommendation Clinical Impression  Pt. admitted today for Rposterior THA. Pt was able to get up and ambulate 15 ft. states she feels tired and the catheter is bothering her. Pt. will benefit fromPT to improve in mobility and demonstrate posterior precautions.    PT Assessment  Patient needs continued PT services    Follow Up Recommendations  Home health PT    Does the patient have the potential to tolerate intense rehabilitation      Barriers to Discharge        Equipment Recommendations  None recommended by PT    Recommendations for Other Services OT consult   Frequency 7X/week    Precautions / Restrictions Precautions Precautions: Posterior Hip Precaution Comments: reviewed and posted posterior prec. Restrictions Weight Bearing Restrictions: No Other Position/Activity Restrictions: WBAT RLE   Pertinent Vitals/Pain 0 to 8 after mobilizing. Ice placed on R hip      Mobility  Bed Mobility Bed Mobility: Supine to Sit;Sit to Supine;Sitting - Scoot to Edge of Bed Supine to Sit: 4: Min assist Sitting - Scoot to [place] of Bed: 5: Supervision Sit to Supine: 4: Min assist Details for Bed Mobility Assistance: assistance to get RLE onto bed Transfers Transfers: Sit to Stand;Stand to Sit Sit to Stand: 4: Min assist;With upper extremity assist;From bed Stand to Sit: To bed;With upper extremity assist Details for Transfer Assistance: verbal cues for RLE placement and to use UE's for transition. Ambulation/Gait Ambulation/Gait Assistance: 4: Min assist Ambulation Distance (Feet): 15 Feet Assistive device: Rolling walker Ambulation/Gait Assistance Details: VC for RLE precautions with turns and for WBAT. Gait Pattern: Step-to pattern;Decreased stance time - right;Decreased step  length - right;Antalgic    Shoulder Instructions     Exercises Total Joint Exercises Ankle Circles/Pumps: AROM;Right Heel Slides: AAROM;Right;10 reps;Supine Hip ABduction/ADduction: AAROM;Right;10 reps;Supine   PT Diagnosis: Difficulty walking;Acute pain  PT Problem List: Decreased range of motion;Decreased activity tolerance;Decreased mobility;Decreased safety awareness;Decreased knowledge of precautions;Pain PT Treatment Interventions: DME instruction;Gait training;Stair training;Functional mobility training;Therapeutic activities;Patient/family education;Therapeutic exercise   PT Goals Acute Rehab PT Goals PT Goal Formulation: With patient/family Time For Goal Achievement: 11/04/11 Potential to Achieve Goals: Good Pt will go Supine/Side to Sit: with supervision PT Goal: Supine/Side to Sit - Progress: Goal set today Pt will go Sit to Supine/Side: with supervision PT Goal: Sit to Supine/Side - Progress: Goal set today Pt will go Sit to Stand: with supervision PT Goal: Sit to Stand - Progress: Goal set today Pt will go Stand to Sit: with supervision PT Goal: Stand to Sit - Progress: Goal set today Pt will Ambulate: 51 - 150 feet;with rolling walker;with supervision PT Goal: Ambulate - Progress: Goal set today Pt will Go Up / Down Stairs: 3-5 stairs;with min assist;with least restrictive assistive device PT Goal: Up/Down Stairs - Progress: Goal set today Additional Goals Additional Goal #1: demo/state 3/3 posterior hip precautions. PT Goal: Additional Goal #1 - Progress: Goal set today  Visit Information  Last PT Received On: 10/28/11 Assistance Needed: +1    Subjective Data  Subjective:  does not hurt at rest Patient Stated Goal: to walk with no pain   Prior Functioning  Home Living Lives With: Spouse Available Help at Discharge: Family Type of Home: House Home Access: Stairs to enter [employer] of Steps: 3-4 Entrance Stairs-Rails: None Home Layout: Two  level;Able to live on main level with bedroom/bathroom Bathroom Shower/Tub: Engineer, manufacturing systems: Standard Home Adaptive Equipment: Walker - rolling Prior Function Level of Independence: Independent Able to Take Stairs?: Yes Driving: Yes Vocation: Full time employment Communication Communication: No difficulties    Cognition  Overall Cognitive Status: Appears within functional limits for tasks assessed/performed Arousal/Alertness: Awake/alert Orientation Level: Appears intact for tasks assessed Behavior During Session: [facility] for tasks performed    Extremity/Trunk Assessment Left Upper Extremity Assessment LUE Sensation: WFL - Light Touch Right Lower Extremity Assessment RLE ROM/Strength/Tone: Deficits RLE ROM/Strength/Tone Deficits: able to flex hip to 45 in supine with min assit.  pt did slide leg to edg of bed with min assist and back onto bed with mina assist. RLE Sensation: WFL - Light Touch Left Lower Extremity Assessment LLE ROM/Strength/Tone: WFL for tasks assessed   Balance    End of Session PT - End of Session Activity Tolerance: Patient tolerated treatment well Patient left: in bed;with call bell/phone within reach;with family/visitor present Nurse Communication: Mobility status  GP     Rada Hay 10/28/2011, 5:13 PM

## 2011-10-29 ENCOUNTER — Encounter (HOSPITAL_COMMUNITY): Payer: Self-pay | Admitting: Orthopedic Surgery

## 2011-10-29 DIAGNOSIS — D62 Acute posthemorrhagic anemia: Secondary | ICD-10-CM | POA: Diagnosis not present

## 2011-10-29 LAB — TYPE AND SCREEN: Antibody Screen: NEGATIVE

## 2011-10-29 LAB — CBC
Hemoglobin: 8.9 g/dL — ABNORMAL LOW (ref 12.0–15.0)
MCH: 29.7 pg (ref 26.0–34.0)
Platelets: 209 10*3/uL (ref 150–400)
RBC: 3 MIL/uL — ABNORMAL LOW (ref 3.87–5.11)
WBC: 14 10*3/uL — ABNORMAL HIGH (ref 4.0–10.5)

## 2011-10-29 LAB — BASIC METABOLIC PANEL
CO2: 23 mEq/L (ref 19–32)
Chloride: 105 mEq/L (ref 96–112)
Creatinine, Ser: 1.1 mg/dL (ref 0.50–1.10)
GFR calc Af Amer: 61 mL/min — ABNORMAL LOW (ref >90)
Potassium: 4.2 mEq/L (ref 3.5–5.1)
Sodium: 137 mEq/L (ref 135–145)

## 2011-10-29 MED ORDER — SODIUM CHLORIDE 0.9 % IV SOLN
INTRAVENOUS | Status: DC
  Administered 2011-10-29: 13:00:00 via INTRAVENOUS

## 2011-10-29 MED ORDER — SODIUM CHLORIDE 0.9 % IV BOLUS (SEPSIS)
250.0000 mL | Freq: Once | INTRAVENOUS | Status: AC
  Administered 2011-10-29: 1000 mL via INTRAVENOUS

## 2011-10-29 MED ORDER — POLYSACCHARIDE IRON COMPLEX 150 MG PO CAPS
150.0000 mg | ORAL_CAPSULE | Freq: Two times a day (BID) | ORAL | Status: DC
  Administered 2011-10-29 – 2011-10-30 (×2): 150 mg via ORAL
  Filled 2011-10-29 (×4): qty 1

## 2011-10-29 NOTE — Progress Notes (Signed)
   Subjective: 1 Day Post-Op Procedure(s) (LRB): TOTAL HIP ARTHROPLASTY (Right) Patient reports pain as mild and moderate.  Patient had a tough night with pain getting up and back into bed.  Better this morning. Patient seen in rounds with Dr. Lequita Halt. Patient is well, and has had no acute complaints or problems We will start therapy today.  Plan is to go Home after hospital stay.  Objective: Vital signs in last 24 hours: Temp:  [97.4 F (36.3 C)-98.6 F (37 C)] 98.4 F (36.9 C) (10/24 0615) Pulse Rate:  [72-92] 75  (10/24 0615) Resp:  [9-19] 14  (10/24 0615) BP: (94-125)/(58-88) 99/66 mmHg (10/24 0615) SpO2:  [93 %-100 %] 98 % (10/24 0615) Weight:  [69.854 kg (154 lb)] 69.854 kg (154 lb) (10/23 1200)  Intake/Output from previous day:  Intake/Output Summary (Last 24 hours) at 10/29/11 0759 Last data filed at 10/29/11 0616  Gross per 24 hour  Intake   3630 ml  Output   3480 ml  Net    150 ml    Intake/Output this shift: UOP 1775  Labs:  Basename 10/29/11 0430  HGB 8.9*    Basename 10/29/11 0430  WBC 14.0*  RBC 3.00*  HCT 27.0*  PLT 209    Basename 10/29/11 0430  NA 137  K 4.2  CL 105  CO2 23  BUN 9  CREATININE 1.10  GLUCOSE 143*  CALCIUM 8.7   No results found for this basename: LABPT:2,INR:2 in the last 72 hours  EXAM General - Patient is Alert, Appropriate and Oriented Extremity - Neurovascular intact Sensation intact distally Dorsiflexion/Plantar flexion intact Dressing - dressing C/D/I Motor Function - intact, moving foot and toes well on exam.  Hemovac pulled without difficulty.  Past Medical History  Diagnosis Date  . Hypertension   . GERD (gastroesophageal reflux disease)   . Arthritis   . Hepatitis     Assessment/Plan: 1 Day Post-Op Procedure(s) (LRB): TOTAL HIP ARTHROPLASTY (Right) Principal Problem:  *OA (osteoarthritis) of hip Active Problems:  Postop Acute blood loss anemia  Estimated Body mass index is 25.63 kg/(m^2) as  calculated from the following:   Height as of this encounter: 5\' 5" (1.651 m).   Weight as of this encounter: 154 lb(69.854 kg). Advance diet Up with therapy Discharge home with home health  DVT Prophylaxis - Xarelto Weight Bearing As Tolerated right Leg D/C Knee Immobilizer Hemovac Pulled Begin Therapy Hip Preacutions No vaccines.  Megyn Leng 10/29/2011, 7:59 AM

## 2011-10-29 NOTE — Care Management Note (Addendum)
    Page 1 of 2   10/30/2011     5:02:17 PM   CARE MANAGEMENT NOTE 10/30/2011  Patient:  Lisa French, Lisa French   Account Number:  [id number]  Date Initiated:  10/29/2011  Documentation initiated by:  Colleen Can  Subjective/Objective Assessment:   dx osteoarethritis right hip; total hip replacemnt     Action/Plan:   CM spoke with patient and spouse. Plans are for patient to return to her home in [place]  where spouse will be caregiver. She is requesting [facility] and wants specific physical therapist if possible. Has RW, needs 3n1   Anticipated DC Date:  11/01/2011   Anticipated DC Plan:  HOME W HOME HEALTH SERVICES  In-house referral  NA      DC Planning Services  CM consult      [facility] Choice  HOME HEALTH   Choice offered to / List presented to:  C-1 Patient   DME arranged  3-N-1      DME agency  [facility]     HH arranged  HH-2 PT      [facility] agency  [facility]   Status of service:  Completed, signed off Medicare Important Message given?  NO (If response is "NO", the following Medicare IM given date fields will be blank) Date Medicare IM given:   Date Additional Medicare IM given:    Discharge Disposition:  HOME W HOME HEALTH SERVICES  Per UR Regulation:    If discussed at Long Length of Stay Meetings, dates discussed:    Comments:  10/30/2011 Raynelle Bring, BSN CCM [phone] PT DISCHARGED TO HOME [facility] SERVICES IN PLACE, [facility] TOMORROW 10/31/2011

## 2011-10-29 NOTE — Progress Notes (Signed)
Physical Therapy Treatment Patient Details Name: Lisa French MRN: [id number] DOB: 1950/10/27 Today's Date: 10/29/2011 Time: 4098-1191 PT Time Calculation (min): 23 min  PT Assessment / Plan / Recommendation Comments on Treatment Session  Pt able to ambulate short distance in hallway and then performed exercises.  Reviewed and demonstrated hip precautions with mobility.    Follow Up Recommendations  Home health PT     Does the patient have the potential to tolerate intense rehabilitation     Barriers to Discharge        Equipment Recommendations  None recommended by PT    Recommendations for Other Services    Frequency     Plan Discharge plan remains appropriate;Frequency remains appropriate    Precautions / Restrictions Precautions Precautions: Posterior Hip Precaution Comments: Pt able to verbalize 2/3 hip precautions. Restrictions Weight Bearing Restrictions: No Other Position/Activity Restrictions: WBAT RLE   Pertinent Vitals/Pain 4/10 R hip, premedicated, ice applied, repositioned    Mobility  Bed Mobility Bed Mobility: Supine to Sit Supine to Sit: 5: Supervision Details for Bed Mobility Assistance: verbal cues for technique Transfers Transfers: Sit to Stand;Stand to Sit Sit to Stand: 4: Min guard;With upper extremity assist;From bed Stand to Sit: 4: Min guard;With upper extremity assist;To chair/3-in-1 Details for Transfer Assistance: verbal cue for R LE forward Ambulation/Gait Ambulation/Gait Assistance: 4: Min guard;5: Supervision Ambulation Distance (Feet): 50 Feet Assistive device: Rolling walker Ambulation/Gait Assistance Details: verbal cues for no internal rotation and turning toward unaffected leg Gait Pattern: Step-to pattern;Decreased stance time - right;Decreased step length - right;Antalgic    Exercises Total Joint Exercises Ankle Circles/Pumps: AROM;20 reps;Both Quad Sets: AROM;Both;20 reps Gluteal Sets: AROM;20 reps;Both Towel Squeeze:  AROM;20 reps;Both Short Arc Quad: AROM;Strengthening;Right;20 reps Heel Slides: AAROM;Right;20 reps;Other (comment) (within precautions) Hip ABduction/ADduction: AAROM;Right;20 reps   PT Diagnosis:    PT Problem List:   PT Treatment Interventions:     PT Goals Acute Rehab PT Goals PT Goal: Supine/Side to Sit - Progress: Partly met PT Goal: Sit to Stand - Progress: Progressing toward goal PT Goal: Stand to Sit - Progress: Progressing toward goal PT Goal: Ambulate - Progress: Progressing toward goal Additional Goals PT Goal: Additional Goal #1 - Progress: Progressing toward goal  Visit Information  Last PT Received On: 10/29/11 Assistance Needed: +1    Subjective Data  Subjective: I got these slippers and this robe for the occasion.   Cognition  Overall Cognitive Status: Appears within functional limits for tasks assessed/performed    Balance     End of Session PT - End of Session Activity Tolerance: Patient tolerated treatment well Patient left: in chair;with call bell/phone within reach;with family/visitor present   GP     Teren Zurcher,KATHrine E 10/29/2011, 10:23 AM Pager: [phone]

## 2011-10-29 NOTE — Plan of Care (Signed)
Problem: Consults Goal: Diagnosis- Total Joint Replacement Outcome: Completed/Met Date Met:  10/29/11 Primary Total Hip RIGHT

## 2011-10-29 NOTE — Progress Notes (Signed)
Physical Therapy Treatment Note   10/29/11 1500  PT Visit Information  Last PT Received On 10/29/11  Assistance Needed +1  PT Time Calculation  PT Start Time 1444  PT Stop Time 1457  PT Time Calculation (min) 13 min  Subjective Data  Subjective I need to make a trip to the bathroom first.  Precautions  Precautions Posterior Hip  Precaution Comments pt able to verbalize all precautions  Restrictions  Other Position/Activity Restrictions WBAT RLE  Cognition  Overall Cognitive Status Appears within functional limits for tasks assessed/performed  Bed Mobility  Bed Mobility Supine to Sit  Supine to Sit 5: Supervision  Sit to Supine 5: Supervision  Transfers  Transfers Sit to Stand;Stand to Sit  Sit to Stand 5: Supervision;With upper extremity assist;From chair/3-in-1;From bed  Stand to Sit 5: Supervision;With upper extremity assist;To chair/3-in-1;To bed  Details for Transfer Assistance verbal cues for R LE forward  Ambulation/Gait  Ambulation/Gait Assistance 5: Supervision  Ambulation Distance (Feet) 200 Feet  Assistive device Rolling walker  Ambulation/Gait Assistance Details occasional cue for no internal rotation, turning toward unaffect LE  Gait Pattern Step-to pattern;Decreased stance time - right;Decreased step length - right;Antalgic  Gait velocity decreased  PT - End of Session  Activity Tolerance Patient tolerated treatment well  Patient left with call bell/phone within reach;with family/visitor present;in bed  PT - Assessment/Plan  Comments on Treatment Session Pt ambulated again in hallway and doing well.  Discussed practicing stairs tomorrow prior to d/c.  PT Plan Discharge plan remains appropriate;Frequency remains appropriate  Follow Up Recommendations Home health PT  Equipment Recommended 3 in 1 bedside comode  Acute Rehab PT Goals  PT Goal: Supine/Side to Sit - Progress Met  PT Goal: Sit to Supine/Side - Progress Met  Pt will go Sit to Stand with modified  independence  PT Goal: Sit to Stand - Progress Updated due to goal met  Pt will go Stand to Sit with modified independence  PT Goal: Stand to Sit - Progress Updated due to goals met  Pt will Ambulate 51 - 150 feet;with modified independence;with rolling walker  PT Goal: Ambulate - Progress Updated due to goal met  Additional Goals  PT Goal: Additional Goal #1 - Progress Progressing toward goal  PT General Charges  $$ ACUTE PT VISIT 1 Procedure  PT Treatments  $Gait Training 8-22 mins    Pain: 4/10 R hip, RN to bring meds  Zenovia Jarred, PT Pager: [phone]

## 2011-10-29 NOTE — Evaluation (Signed)
Occupational Therapy Evaluation Patient Details Name: Lisa French MRN: [id number] DOB: 1950/09/15 Today's Date: 10/29/2011 Time: 7846-9629 OT Time Calculation (min): 39 min  OT Assessment / Plan / Recommendation Clinical Impression  Pt presents POD 1 RTHR. Pt performing at a supervision level for all ADLs. All education completed. Pt will have prn A at d/c.    OT Assessment  Patient does not need any further OT services    Follow Up Recommendations  No OT follow up    Barriers to Discharge      Equipment Recommendations  3 in 1 bedside comode    Recommendations for Other Services    Frequency       Precautions / Restrictions Precautions Precautions: Posterior Hip Precaution Comments: Pt able to verbalize 2/3 hip precautions. Restrictions Weight Bearing Restrictions: No Other Position/Activity Restrictions: WBAT RLE   Pertinent Vitals/Pain Reported 2/10 pain in R hip at end of session. Repositioned and cold applied.    ADL  Grooming: Supervision/safety;Performed;Wash/dry hands Where Assessed - Grooming: Unsupported standing Where Assessed - Upper Body Bathing: Unsupported standing Lower Body Bathing: Performed;Supervision/safety Where Assessed - Lower Body Bathing: Supported sit to stand Where Assessed - Upper Body Dressing: Unsupported sitting Lower Body Dressing: Performed;Supervision/safety Where Assessed - Lower Body Dressing: Supported sit to Pharmacist, hospital: Research scientist (life sciences) Method: Sit to Barista: Raised toilet seat with arms (or 3-in-1 over toilet) Toileting - Clothing Manipulation and Hygiene: Performed;Supervision/safety Where Assessed - Engineer, mining and Hygiene: Sit to stand from 3-in-1 or toilet Tub/Shower Transfer: Performed;Supervision/safety Tub/Shower Transfer Method: Science writer: Walk in Scientist, research (physical sciences) Used: Rolling walker;Sock  aid;Long-handled shoe horn;Long-handled sponge;Reacher Transfers/Ambulation Related to ADLs: Pt ambulated to the bathroom with supervision. ADL Comments: Pt educated how to use all AE for LB ADLs with excellent return demo. Pt also educated how to safely step into walk in shower with good return demo.    OT Diagnosis:    OT Problem List:   OT Treatment Interventions:     OT Goals    Visit Information  Last OT Received On: 10/29/11 Assistance Needed: +1    Subjective Data  Subjective: I need to be as independent as possible. Patient Stated Goal: I want to be able to go shopping on black Friday.   Prior Functioning     Home Living Lives With: Spouse Available Help at Discharge: Family Type of Home: House Home Access: Stairs to enter [employer] of Steps: 5 Entrance Stairs-Rails: None Home Layout: Two level;Able to live on main level with bedroom/bathroom Bathroom Shower/Tub: Walk-in shower Home Adaptive Equipment: Walker - rolling Prior Function Level of Independence: Independent Able to Take Stairs?: Yes Driving: Yes Vocation: Full time employment         Vision/Perception     Cognition  Overall Cognitive Status: Appears within functional limits for tasks assessed/performed Arousal/Alertness: Awake/alert Orientation Level: Appears intact for tasks assessed Behavior During Session: [facility] for tasks performed    Extremity/Trunk Assessment Right Upper Extremity Assessment RUE ROM/Strength/Tone: [facility] for tasks assessed Left Upper Extremity Assessment LUE ROM/Strength/Tone: WFL for tasks assessed     Mobility Bed Mobility Bed Mobility: Supine to Sit Supine to Sit: 5: Supervision Details for Bed Mobility Assistance: verbal cues for technique Transfers Sit to Stand: 5: Supervision;With armrests;From chair/3-in-1;With upper extremity assist Stand to Sit: 5: Supervision;With upper extremity assist;With armrests;To chair/3-in-1 Details for Transfer  Assistance: Min cues for hand placement and RLE management.     Shoulder Instructions  Exercise   Balance     End of Session OT - End of Session Activity Tolerance: Patient tolerated treatment well Patient left: in chair;with call bell/phone within reach;with family/visitor present  GO     Tamber Burtch A OTR/L [phone] 10/29/2011, 11:05 AM

## 2011-10-30 LAB — CBC
HCT: 25.6 % — ABNORMAL LOW (ref 36.0–46.0)
Hemoglobin: 8.5 g/dL — ABNORMAL LOW (ref 12.0–15.0)
MCV: 91.1 fL (ref 78.0–100.0)
RBC: 2.81 MIL/uL — ABNORMAL LOW (ref 3.87–5.11)
WBC: 13.4 10*3/uL — ABNORMAL HIGH (ref 4.0–10.5)

## 2011-10-30 LAB — BASIC METABOLIC PANEL
BUN: 12 mg/dL (ref 6–23)
CO2: 27 mEq/L (ref 19–32)
Chloride: 102 mEq/L (ref 96–112)
Creatinine, Ser: 1.21 mg/dL — ABNORMAL HIGH (ref 0.50–1.10)
Glucose, Bld: 115 mg/dL — ABNORMAL HIGH (ref 70–99)
Potassium: 4 mEq/L (ref 3.5–5.1)

## 2011-10-30 MED ORDER — POLYSACCHARIDE IRON COMPLEX 150 MG PO CAPS: 150.0000 mg | ORAL_CAPSULE | Freq: Two times a day (BID) | ORAL | Status: DC

## 2011-10-30 MED ORDER — RIVAROXABAN 10 MG PO TABS: 10.0000 mg | ORAL_TABLET | Freq: Every day | ORAL | Status: DC

## 2011-10-30 MED ORDER — OXYCODONE HCL 5 MG PO TABS: 5.0000 mg | ORAL_TABLET | ORAL | Status: DC | PRN

## 2011-10-30 MED ORDER — METHOCARBAMOL 500 MG PO TABS: 500.0000 mg | ORAL_TABLET | Freq: Four times a day (QID) | ORAL | Status: DC | PRN

## 2011-10-30 NOTE — Progress Notes (Signed)
Physical Therapy Treatment Patient Details Name: TUYEN UNCAPHER MRN: [id number] DOB: [birth year] Today's Date: 10/30/2011 Time: 4098-1191 PT Time Calculation (min): 28 min  PT Assessment / Plan / Recommendation Comments on Treatment Session  Pt ambulated to stairs with rest break due to pain.  Pt then performed stairs and taken back to room in recliner.  Pt able to perform exercises as well with spouse present and educated on both stairs and exercises.    Follow Up Recommendations  Home health PT     Does the patient have the potential to tolerate intense rehabilitation     Barriers to Discharge        Equipment Recommendations  3 in 1 bedside comode    Recommendations for Other Services    Frequency     Plan Discharge plan remains appropriate;Frequency remains appropriate    Precautions / Restrictions Precautions Precautions: Posterior Hip Precaution Comments: pt able to verbalize all precautions Restrictions Other Position/Activity Restrictions: WBAT RLE   Pertinent Vitals/Pain 5/10 R hip pain, premedicated, will use call button if pain does not decrease with rest    Mobility  Bed Mobility Bed Mobility: Supine to Sit;Sit to Supine Supine to Sit: 5: Supervision Sit to Supine: 5: Supervision Details for Bed Mobility Assistance: verbal cues for precautions Transfers Transfers: Sit to Stand;Stand to Sit Sit to Stand: 5: Supervision;With upper extremity assist;From chair/3-in-1;From bed Stand to Sit: 5: Supervision;With upper extremity assist;To chair/3-in-1;To bed Details for Transfer Assistance: 2 verbal cues for R LE forward Ambulation/Gait Ambulation/Gait Assistance: 5: Supervision Ambulation Distance (Feet): 180 Feet Assistive device: Rolling walker Ambulation/Gait Assistance Details: verbal cues for step through pattern  Gait Pattern: Step-through pattern;Decreased stance time - right;Antalgic Gait velocity: decreased Stairs: Yes Stairs Assistance: 4: Min  guard Stairs Assistance Details (indicate cue type and reason): demonstrated technique for pt and spouse, pt then performed with verbal cues for sequence and RW placement and spouse assisting, performed x2 with seated rest break inbetween Stair Management Technique: Step to pattern;Backwards;With walker Number of Stairs: 4     Exercises Total Joint Exercises Ankle Circles/Pumps: AROM;20 reps;Both Quad Sets: AROM;Both;20 reps Gluteal Sets: AROM;20 reps;Both Towel Squeeze: AROM;20 reps;Both Short Arc Quad: AROM;Strengthening;Right;20 reps Heel Slides: AAROM;Right;20 reps;Other (comment) (within precautions) Hip ABduction/ADduction: AAROM;Right;20 reps   PT Diagnosis:    PT Problem List:   PT Treatment Interventions:     PT Goals Acute Rehab PT Goals PT Goal: Sit to Stand - Progress: Progressing toward goal PT Goal: Stand to Sit - Progress: Progressing toward goal PT Goal: Ambulate - Progress: Progressing toward goal PT Goal: Up/Down Stairs - Progress: Met Additional Goals PT Goal: Additional Goal #1 - Progress: Progressing toward goal  Visit Information  Last PT Received On: 10/30/11 Assistance Needed: +1    Subjective Data  Subjective: I'd like to do 2 sessions before I leave.   Cognition  Overall Cognitive Status: Appears within functional limits for tasks assessed/performed    Balance     End of Session PT - End of Session Activity Tolerance: Patient tolerated treatment well Patient left: with call bell/phone within reach;with family/visitor present;in bed   GP     Elisheba Mcdonnell,KATHrine E 10/30/2011, 12:26 PM Pager: [phone]

## 2011-10-30 NOTE — Progress Notes (Signed)
Physical Therapy Treatment Note   10/30/11 1400  PT Visit Information  Last PT Received On 10/30/11  Assistance Needed +1  PT Time Calculation  PT Start Time 1414  PT Stop Time 1424  PT Time Calculation (min) 10 min  Subjective Data  Subjective If I stay, all I will do is sleep, and I'd rather do that at home.  (pt feels ready for d/c home today)  Precautions  Precautions Posterior Hip  Precaution Comments pt able to verbalize all precautions  Restrictions  Other Position/Activity Restrictions WBAT RLE  Cognition  Overall Cognitive Status Appears within functional limits for tasks assessed/performed  Bed Mobility  Bed Mobility Supine to Sit  Supine to Sit 6: Modified independent (Device/Increase time);HOB elevated  Details for Bed Mobility Assistance increased time  Transfers  Transfers Sit to Stand;Stand to Sit  Sit to Stand 5: Supervision;With upper extremity assist;From bed  Stand to Sit 5: Supervision;With upper extremity assist;To bed  Details for Transfer Assistance one verbal cue for R LE forward  Ambulation/Gait  Ambulation/Gait Assistance 5: Supervision  Ambulation Distance (Feet) 120 Feet  Assistive device Rolling walker  Ambulation/Gait Assistance Details step to pattern this afternoon due to pain, cued to use upper body through RW to assist with pain control  Gait Pattern Step-to pattern;Antalgic  Gait velocity decreased  PT - End of Session  Activity Tolerance Patient tolerated treatment well  Patient left with call bell/phone within reach;with family/visitor present;in bed (sitting EOB with spouse (to get dressed))  PT - Assessment/Plan  Comments on Treatment Session Pt given exercise handout.  Pt ambulated again this afternoon and having 6/10 pain however reports she would still like to d/c home today if possible.  Reviewed maintaining precautions with bed mobility and turning with ambulation.  Pt had no further questions/concerns about d/c home.  PT Plan  Discharge plan remains appropriate;Frequency remains appropriate  Follow Up Recommendations Home health PT  Equipment Recommended 3 in 1 bedside comode  Acute Rehab PT Goals  PT Goal: Sit to Stand - Progress Progressing toward goal  PT Goal: Stand to Sit - Progress Progressing toward goal  PT Goal: Ambulate - Progress Progressing toward goal  Additional Goals  PT Goal: Additional Goal #1 - Progress Partly met  PT General Charges  $$ ACUTE PT VISIT 1 Procedure  PT Treatments  $Gait Training 8-22 mins    Zenovia Jarred, PT Pager: [phone]

## 2011-10-30 NOTE — Progress Notes (Signed)
   Subjective: 2 Days Post-Op Procedure(s) (LRB): TOTAL HIP ARTHROPLASTY (Right) Patient reports pain as mild.   Patient seen in rounds for Dr. Lequita Halt.  Did fairly well with therapy but more sore today.  Will see how she does today to determine if OK for this evening or tomorrow discharge. Patient is having problems with pain in the hip and thigh, requiring pain medications Plan is to go Home after hospital stay.  Objective: Vital signs in last 24 hours: Temp:  [97.8 F (36.6 C)-99 F (37.2 C)] 98.9 F (37.2 C) (10/25 0544) Pulse Rate:  [73-96] 96  (10/25 0544) Resp:  [14-16] 16  (10/25 0544) BP: (107-119)/(71-84) 107/71 mmHg (10/25 0544) SpO2:  [95 %-96 %] 96 % (10/25 0544)  Intake/Output from previous day:  Intake/Output Summary (Last 24 hours) at 10/30/11 0815 Last data filed at 10/30/11 0544  Gross per 24 hour  Intake 2126.5 ml  Output   2700 ml  Net -573.5 ml    Intake/Output this shift:    Labs:  Basename 10/30/11 0418 10/29/11 0430  HGB 8.5* 8.9*    Basename 10/30/11 0418 10/29/11 0430  WBC 13.4* 14.0*  RBC 2.81* 3.00*  HCT 25.6* 27.0*  PLT 191 209    Basename 10/30/11 0418 10/29/11 0430  NA 136 137  K 4.0 4.2  CL 102 105  CO2 27 23  BUN 12 9  CREATININE 1.21* 1.10  GLUCOSE 115* 143*  CALCIUM 8.9 8.7   No results found for this basename: LABPT:2,INR:2 in the last 72 hours  EXAM General - Patient is Alert, Appropriate and Oriented Extremity - Neurovascular intact Sensation intact distally Dorsiflexion/Plantar flexion intact No cellulitis present Dressing/Incision - clean, dry, no drainage, healing Motor Function - intact, moving foot and toes well on exam.   Past Medical History  Diagnosis Date  . Hypertension   . GERD (gastroesophageal reflux disease)   . Arthritis   . Hepatitis     Assessment/Plan: 2 Days Post-Op Procedure(s) (LRB): TOTAL HIP ARTHROPLASTY (Right) Principal Problem:  *OA (osteoarthritis) of hip Active Problems:  Postop Acute blood loss anemia  Estimated Body mass index is 25.63 kg/(m^2) as calculated from the following:   Height as of this encounter: 5\' 5" (1.651 m).   Weight as of this encounter: 154 lb(69.854 kg). Up with therapy Plan for discharge tomorrow Discharge home with home health  Diet - Cardiac diet Follow up - in 2 weeks Activity - WBAT Disposition - Home Condition Upon Discharge - Pending at this time.  Will see how she does with therapy. D/C Meds - See DC Summary DVT Prophylaxis - Xarelto  PERKINS, ALEXZANDREW 10/30/2011, 8:15 AM

## 2011-10-30 NOTE — Discharge Summary (Signed)
Physician Discharge Summary   Patient ID: Lisa French MRN: [id number] DOB/AGE: July 18, 1950 62 y.o.  Admit date: 10/28/2011 Discharge date: 10/30/2011  Primary Diagnosis: Osteoarthritis Right hip   Admission Diagnoses:  Past Medical History  Diagnosis Date  . Hypertension   . GERD (gastroesophageal reflux disease)   . Arthritis   . Hepatitis    Discharge Diagnoses:   Principal Problem:  *OA (osteoarthritis) of hip Active Problems:  Postop Acute blood loss anemia  Estimated Body mass index is 25.63 kg/(m^2) as calculated from the following:   Height as of this encounter: 5\' 5" (1.651 m).   Weight as of this encounter: 154 lb(69.854 kg).  Classification of overweight in adults according to BMI (WHO, 1998)   Procedure: Procedure(s) (LRB): TOTAL HIP ARTHROPLASTY (Right)   Consults: None  HPI: Lisa French is a 61 y.o. female with end stage arthritis of her right hip with progressively worsening pain and dysfunction. Pain occurs with activity and rest including pain at night. She has tried analgesics, protected weight bearing and rest without benefit. Her functional problems secondary to the hip are even worse than her pain.. Radiographs demonstrate bone on bone arthritis with subchondral cyst formation. She presents now for right THA.  Laboratory Data: Admission on 10/28/2011  Component Date Value Range Status  . ABO/RH(D) 10/28/2011 A POS   Final  . Antibody Screen 10/28/2011 NEG   Final  . Sample Expiration 10/28/2011 10/31/2011   Final  . ABO/RH(D) 10/28/2011 A POS   Final  . WBC 10/29/2011 14.0* 4.0 - 10.5 K/uL Final  . RBC 10/29/2011 3.00* 3.87 - 5.11 MIL/uL Final  . Hemoglobin 10/29/2011 8.9* 12.0 - 15.0 g/dL Final  . HCT 40/98/1191 27.0* 36.0 - 46.0 % Final  . MCV 10/29/2011 90.0  78.0 - 100.0 fL Final  . MCH 10/29/2011 29.7  26.0 - 34.0 pg Final  . MCHC 10/29/2011 33.0  30.0 - 36.0 g/dL Final  . RDW 47/82/9562 13.2  11.5 - 15.5 % Final  . Platelets  10/29/2011 209  150 - 400 K/uL Final  . Sodium 10/29/2011 137  135 - 145 mEq/L Final  . Potassium 10/29/2011 4.2  3.5 - 5.1 mEq/L Final  . Chloride 10/29/2011 105  96 - 112 mEq/L Final  . CO2 10/29/2011 23  19 - 32 mEq/L Final  . Glucose, Bld 10/29/2011 143* 70 - 99 mg/dL Final  . BUN 13/08/6576 9  6 - 23 mg/dL Final  . Creatinine, Ser 10/29/2011 1.10  0.50 - 1.10 mg/dL Final  . Calcium 46/96/2952 8.7  8.4 - 10.5 mg/dL Final  . GFR calc non Af Amer 10/29/2011 53* >90 mL/min Final  . GFR calc Af Amer 10/29/2011 61* >90 mL/min Final   Comment:                                 The eGFR has been calculated                          using the CKD EPI equation.                          This calculation has not been                          validated in all clinical  situations.                          eGFR's persistently                          <90 mL/min signify                          possible Chronic Kidney Disease.  . WBC 10/30/2011 13.4* 4.0 - 10.5 K/uL Final  . RBC 10/30/2011 2.81* 3.87 - 5.11 MIL/uL Final  . Hemoglobin 10/30/2011 8.5* 12.0 - 15.0 g/dL Final  . HCT 16/10/9602 25.6* 36.0 - 46.0 % Final  . MCV 10/30/2011 91.1  78.0 - 100.0 fL Final  . MCH 10/30/2011 30.2  26.0 - 34.0 pg Final  . MCHC 10/30/2011 33.2  30.0 - 36.0 g/dL Final  . RDW 54/09/8117 13.5  11.5 - 15.5 % Final  . Platelets 10/30/2011 191  150 - 400 K/uL Final  . Sodium 10/30/2011 136  135 - 145 mEq/L Final  . Potassium 10/30/2011 4.0  3.5 - 5.1 mEq/L Final  . Chloride 10/30/2011 102  96 - 112 mEq/L Final  . CO2 10/30/2011 27  19 - 32 mEq/L Final  . Glucose, Bld 10/30/2011 115* 70 - 99 mg/dL Final  . BUN 14/78/2956 12  6 - 23 mg/dL Final  . Creatinine, Ser 10/30/2011 1.21* 0.50 - 1.10 mg/dL Final  . Calcium 21/30/8657 8.9  8.4 - 10.5 mg/dL Final  . GFR calc non Af Amer 10/30/2011 47* >90 mL/min Final  . GFR calc Af Amer 10/30/2011 55* >90 mL/min Final   Comment:                                  The eGFR has been calculated                          using the CKD EPI equation.                          This calculation has not been                          validated in all clinical                          situations.                          eGFR's persistently                          <90 mL/min signify                          possible Chronic Kidney Disease.  Hospital Outpatient Visit on 10/22/2011  Component Date Value Range Status  . aPTT 10/22/2011 37  24 - 37 seconds Final   Comment:                                 IF BASELINE aPTT IS ELEVATED,  SUGGEST PATIENT RISK ASSESSMENT                          BE USED TO DETERMINE APPROPRIATE                          ANTICOAGULANT THERAPY.  . WBC 10/22/2011 5.7  4.0 - 10.5 K/uL Final  . RBC 10/22/2011 4.25  3.87 - 5.11 MIL/uL Final  . Hemoglobin 10/22/2011 12.5  12.0 - 15.0 g/dL Final  . HCT 11/91/4782 38.2  36.0 - 46.0 % Final  . MCV 10/22/2011 89.9  78.0 - 100.0 fL Final  . MCH 10/22/2011 29.4  26.0 - 34.0 pg Final  . MCHC 10/22/2011 32.7  30.0 - 36.0 g/dL Final  . RDW 95/62/1308 13.2  11.5 - 15.5 % Final  . Platelets 10/22/2011 251  150 - 400 K/uL Final  . Sodium 10/22/2011 137  135 - 145 mEq/L Final  . Potassium 10/22/2011 3.7  3.5 - 5.1 mEq/L Final  . Chloride 10/22/2011 100  96 - 112 mEq/L Final  . CO2 10/22/2011 28  19 - 32 mEq/L Final  . Glucose, Bld 10/22/2011 99  70 - 99 mg/dL Final  . BUN 65/78/4696 14  6 - 23 mg/dL Final  . Creatinine, Ser 10/22/2011 1.10  0.50 - 1.10 mg/dL Final  . Calcium 29/52/8413 9.6  8.4 - 10.5 mg/dL Final  . Total Protein 10/22/2011 7.7  6.0 - 8.3 g/dL Final  . Albumin 24/40/1027 4.1  3.5 - 5.2 g/dL Final  . AST 25/36/6440 24  0 - 37 U/L Final  . ALT 10/22/2011 28  0 - 35 U/L Final  . Alkaline Phosphatase 10/22/2011 130* 39 - 117 U/L Final  . Total Bilirubin 10/22/2011 0.3  0.3 - 1.2 mg/dL Final  . GFR calc non Af Amer 10/22/2011 53* >90 mL/min Final    . GFR calc Af Amer 10/22/2011 61* >90 mL/min Final   Comment:                                 The eGFR has been calculated                          using the CKD EPI equation.                          This calculation has not been                          validated in all clinical                          situations.                          eGFR's persistently                          <90 mL/min signify                          possible Chronic Kidney Disease.  Marland Kitchen Prothrombin Time 10/22/2011 12.1  11.6 - 15.2 seconds Final  . INR 10/22/2011 0.90  0.00 - 1.49 Final  . Color, Urine 10/22/2011 YELLOW  YELLOW Final  . APPearance 10/22/2011 CLEAR  CLEAR Final  . Specific Gravity, Urine 10/22/2011 1.011  1.005 - 1.030 Final  . pH 10/22/2011 6.0  5.0 - 8.0 Final  . Glucose, UA 10/22/2011 NEGATIVE  NEGATIVE mg/dL Final  . Hgb urine dipstick 10/22/2011 NEGATIVE  NEGATIVE Final  . Bilirubin Urine 10/22/2011 NEGATIVE  NEGATIVE Final  . Ketones, ur 10/22/2011 NEGATIVE  NEGATIVE mg/dL Final  . Protein, ur 62/95/2841 NEGATIVE  NEGATIVE mg/dL Final  . Urobilinogen, UA 10/22/2011 1.0  0.0 - 1.0 mg/dL Final  . Nitrite 32/44/0102 NEGATIVE  NEGATIVE Final  . Leukocytes, UA 10/22/2011 SMALL* NEGATIVE Final  . MRSA, PCR 10/22/2011 NEGATIVE  NEGATIVE Final  . Staphylococcus aureus 10/22/2011 NEGATIVE  NEGATIVE Final   Comment:                                 The Xpert SA Assay (FDA                          approved for NASAL specimens                          in patients over 40 years of age),                          is one component of                          a comprehensive surveillance                          program.  Test performance has                          been validated by [employer] for patients greater                          than or equal to 72 year old.                          It is not intended                          to diagnose infection nor to                           guide or monitor treatment.  . Squamous Epithelial / LPF 10/22/2011 RARE  RARE Final  . WBC, UA 10/22/2011 0-2  <3 WBC/hpf Final     X-Rays:Dg Chest 2 View  10/22/2011  *RADIOLOGY REPORT*  Clinical Data: Preop for right hip replacement  CHEST - 2 VIEW  Comparison: 07/26/2010  Findings: Cardiomediastinal silhouette is stable.  Mild elevation of the right hemidiaphragm again noted.  Stable calcified granuloma in the right lower lobe posteriorly. Stable left basilar scarring.  IMPRESSION: No active disease.  No significant change.   Original Report Authenticated By: Lang Snow  POP, M.D.    Dg Hip Complete Right  10/22/2011  *RADIOLOGY REPORT*  Clinical Data: Preop right hip surgery.  RIGHT HIP - COMPLETE 2+ VIEW  Comparison: None.  Findings: Right hip joint degenerative changes.  IMPRESSION: Right hip joint degenerative changes.   Original Report Authenticated By: Fuller Canada, M.D.    Dg Pelvis Portable  10/28/2011  *RADIOLOGY REPORT*  Clinical Data: Postop for right hip arthroplasty.  PORTABLE PELVIS  Comparison: Abdominal pelvic CT dated 12/01/2005 from [facility].  Findings: AP view pelvis.  Interval right hip arthroplasty.  No acute hardware complication or periprosthetic fracture.  Surgical drain and air surrounding the joint  IMPRESSION: Expected appearance after right hip arthroplasty.   Original Report Authenticated By: Consuello Bossier, M.D.    Dg Hip Portable 1 View Right  10/28/2011  *RADIOLOGY REPORT*  Clinical Data: Postop for right hip arthroplasty.  PORTABLE RIGHT HIP - 1 VIEW  Comparison: Preop films of 10/22/2011  Findings: Placement of a right hip arthroplasty.  No periprosthetic fracture or acute hardware complication.  Surgical drain and postoperative air project about the joint.  IMPRESSION: Expected appearance after right hip arthroplasty.   Original Report Authenticated By: Consuello Bossier, M.D.     EKG: Orders placed during the hospital encounter of  10/28/11  . EKG     Hospital Course:  Patient was admitted to [facility] and taken to the OR and underwent the above state procedure without complications.  Patient tolerated the procedure well and was later transferred to the recovery room and then to the orthopaedic floor for postoperative care.  They were given PO and IV analgesics for pain control following their surgery.  They were given 24 hours of postoperative antibiotics of  Anti-infectives     Start     Dose/Rate Route Frequency Ordered Stop   10/28/11 1400   ceFAZolin (ANCEF) IVPB 1 g/50 mL premix        1 g 100 mL/hr over 30 Minutes Intravenous Every 6 hours 10/28/11 1254 10/28/11 2131   10/28/11 0531   ceFAZolin (ANCEF) IVPB 2 g/50 mL premix        2 g 100 mL/hr over 30 Minutes Intravenous 60 min pre-op 10/28/11 0531 10/28/11 0730         and started on DVT prophylaxis in the form of Xarelto.   PT and OT were ordered for total hip protocol.  The patient was allowed to be WBAT with therapy. Discharge planning was consulted to help with postop disposition and equipment needs.  Patient had a tough night on the evening of surgery and started to get up OOB with therapy on day one and walked 50 feet and then later 200 feet.  Hemovac drain was pulled without difficulty.  The knee immobilizer was removed and discontinued.  Continued to work with therapy into day two.  Dressing was changed on day two and the incision was healing well.  Patient was seen in rounds on day two and it was felt that she might be able to go home if she did well with tterapy. She walked twice and progressed and was ready to go home later that afternoon.  Discharge Medications: Prior to Admission medications   Medication Sig Start Date End Date Taking? Authorizing Provider  lisinopril (PRINIVIL,ZESTRIL) 10 MG tablet Take 10 mg by mouth every morning.   Yes Historical Provider, MD  iron polysaccharides (NIFEREX) 150 MG capsule Take 1 capsule (150 mg  total) by mouth  2 (two) times daily. 10/30/11   Suzanna Zahn Julien Girt, PA  methocarbamol (ROBAXIN) 500 MG tablet Take 1 tablet (500 mg total) by mouth every 6 (six) hours as needed. 10/30/11   Vyla Pint Julien Girt, PA  nortriptyline (PAMELOR) 25 MG capsule Take 25 mg by mouth at bedtime.    Historical Provider, MD  oxyCODONE (OXY IR/ROXICODONE) 5 MG immediate release tablet Take 1-2 tablets (5-10 mg total) by mouth every 3 (three) hours as needed. 10/30/11   Zyrus Hetland Julien Girt, PA  rivaroxaban (XARELTO) 10 MG TABS tablet Take 1 tablet (10 mg total) by mouth daily with breakfast. Take Xarelto for two and a half more weeks, then discontinue Xarelto. 10/30/11   Pollyanna Levay Julien Girt, PA    Diet: Cardiac diet Activity:WBAT No bending hip over 90 degrees- A "L" Angle Do not cross legs Do not let foot roll inward When turning these patients a pillow should be placed between the patient's legs to prevent crossing. Patients should have the affected knee fully extended when trying to sit or stand from all surfaces to prevent excessive hip flexion. When ambulating and turning toward the affected side the affected leg should have the toes turned out prior to moving the walker and the rest of patient's body as to prevent internal rotation/ turning in of the leg. Abduction pillows are the most effective way to prevent a patient from not crossing legs or turning toes in at rest. If an abduction pillow is not ordered placing a regular pillow length wise between the patient's legs is also an effective reminder. It is imperative that these precautions be maintained so that the surgical hip does not dislocate. Follow-up:in 2 weeks Disposition - Home Discharged Condition: good   Discharge Orders    Future Orders Please Complete By Expires   Diet - low sodium heart healthy      Call MD / Call 911      Comments:   If you experience chest pain or shortness of breath, CALL 911 and be transported to the hospital  emergency room.  If you develope a fever above 101 F, pus (white drainage) or increased drainage or redness at the wound, or calf pain, call your surgeon's office.   Discharge instructions      Comments:   Pick up stool softner and laxative for home. Do not submerge incision under water. May shower. Continue to use ice for pain and swelling from surgery. Hip precautions.  Total Hip Protocol.  Take Xarelto for two and a half more weeks, then discontinue Xarelto.   Constipation Prevention      Comments:   Drink plenty of fluids.  Prune juice may be helpful.  You may use a stool softener, such as Colace (over the counter) 100 mg twice a day.  Use MiraLax (over the counter) for constipation as needed.   Increase activity slowly as tolerated      Patient may shower      Comments:   You may shower without a dressing once there is no drainage.  Do not wash over the wound.  If drainage remains, do not shower until drainage stops.   Weight bearing as tolerated      Driving restrictions      Comments:   No driving until released by the physician.   Lifting restrictions      Comments:   No lifting until released by the physician.   Follow the hip precautions as taught in Physical Therapy      Change dressing  Comments:   You may change your dressing dressing daily with sterile 4 x 4 inch gauze dressing and paper tape.  Do not submerge the incision under water.   TED hose      Comments:   Use stockings (TED hose) for 3 weeks on both leg(s).  You may remove them at night for sleeping.   Do not sit on low chairs, stoools or toilet seats, as it may be difficult to get up from low surfaces          Medication List     As of 10/30/2011  8:23 AM    TAKE these medications         iron polysaccharides 150 MG capsule   Commonly known as: NIFEREX   Take 1 capsule (150 mg total) by mouth 2 (two) times daily.      lisinopril 10 MG tablet   Commonly known as: PRINIVIL,ZESTRIL   Take 10 mg  by mouth every morning.      methocarbamol 500 MG tablet   Commonly known as: ROBAXIN   Take 1 tablet (500 mg total) by mouth every 6 (six) hours as needed.      nortriptyline 25 MG capsule   Commonly known as: PAMELOR   Take 25 mg by mouth at bedtime.      oxyCODONE 5 MG immediate release tablet   Commonly known as: Oxy IR/ROXICODONE   Take 1-2 tablets (5-10 mg total) by mouth every 3 (three) hours as needed.      rivaroxaban 10 MG Tabs tablet   Commonly known as: XARELTO   Take 1 tablet (10 mg total) by mouth daily with breakfast. Take Xarelto for two and a half more weeks, then discontinue Xarelto.           Follow-up Information    Follow up with Loanne Drilling, MD. Schedule an appointment as soon as possible for a visit in 2 weeks.   Contact information:   [address] [place] [address] [phone]          Signed: Patrica Duel 10/30/2011, 8:23 AM

## 2012-02-20 ENCOUNTER — Other Ambulatory Visit: Payer: Self-pay

## 2012-05-11 ENCOUNTER — Other Ambulatory Visit (HOSPITAL_COMMUNITY): Payer: Self-pay | Admitting: Orthopedic Surgery

## 2012-05-11 DIAGNOSIS — M25551 Pain in right hip: Secondary | ICD-10-CM

## 2012-05-23 ENCOUNTER — Encounter (HOSPITAL_COMMUNITY)
Admission: RE | Admit: 2012-05-23 | Discharge: 2012-05-23 | Disposition: A | Discharge status: Home / Self Care | Payer: BC Managed Care – PPO | Source: Ambulatory Visit | Attending: Orthopedic Surgery | Admitting: Orthopedic Surgery

## 2012-05-23 DIAGNOSIS — M79609 Pain in unspecified limb: Secondary | ICD-10-CM | POA: Insufficient documentation

## 2012-05-23 DIAGNOSIS — M25551 Pain in right hip: Secondary | ICD-10-CM

## 2012-05-23 DIAGNOSIS — Z96649 Presence of unspecified artificial hip joint: Secondary | ICD-10-CM | POA: Insufficient documentation

## 2012-05-23 MED ORDER — TECHNETIUM TC 99M MEDRONATE IV KIT
25.5000 | PACK | Freq: Once | INTRAVENOUS | Status: AC | PRN
  Administered 2012-05-23: 25.5 via INTRAVENOUS

## 2012-11-10 ENCOUNTER — Other Ambulatory Visit: Payer: Self-pay

## 2013-08-22 ENCOUNTER — Ambulatory Visit: Payer: BC Managed Care – PPO | Admitting: Gastroenterology

## 2014-04-11 ENCOUNTER — Other Ambulatory Visit (HOSPITAL_COMMUNITY): Payer: Self-pay | Admitting: Orthopedic Surgery

## 2014-04-11 DIAGNOSIS — Z471 Aftercare following joint replacement surgery: Secondary | ICD-10-CM

## 2014-04-11 DIAGNOSIS — Z96641 Presence of right artificial hip joint: Principal | ICD-10-CM

## 2014-04-16 ENCOUNTER — Encounter (HOSPITAL_COMMUNITY)
Admission: RE | Admit: 2014-04-16 | Discharge: 2014-04-16 | Disposition: A | Discharge status: Home / Self Care | Payer: BLUE CROSS/BLUE SHIELD | Source: Ambulatory Visit | Attending: Orthopedic Surgery | Admitting: Orthopedic Surgery

## 2014-04-16 ENCOUNTER — Ambulatory Visit (HOSPITAL_COMMUNITY)
Admission: RE | Admit: 2014-04-16 | Discharge: 2014-04-16 | Disposition: A | Discharge status: Home / Self Care | Payer: BLUE CROSS/BLUE SHIELD | Source: Ambulatory Visit | Attending: Orthopedic Surgery | Admitting: Orthopedic Surgery

## 2014-04-16 DIAGNOSIS — Z96641 Presence of right artificial hip joint: Secondary | ICD-10-CM | POA: Diagnosis not present

## 2014-04-16 DIAGNOSIS — Z471 Aftercare following joint replacement surgery: Secondary | ICD-10-CM | POA: Diagnosis present

## 2014-04-16 MED ORDER — TECHNETIUM TC 99M MEDRONATE IV KIT
25.0000 | PACK | Freq: Once | INTRAVENOUS | Status: AC | PRN
Start: 2014-04-16 — End: 2014-04-16
  Administered 2014-04-16: 25 via INTRAVENOUS

## 2014-08-29 ENCOUNTER — Encounter (HOSPITAL_COMMUNITY): Admission: RE | Payer: Self-pay | Source: Ambulatory Visit

## 2014-08-29 ENCOUNTER — Inpatient Hospital Stay (HOSPITAL_COMMUNITY)
Admission: RE | Admit: 2014-08-29 | Payer: BLUE CROSS/BLUE SHIELD | Source: Ambulatory Visit | Admitting: Orthopedic Surgery

## 2014-08-29 SURGERY — TOTAL HIP REVISION
Anesthesia: Choice | Site: Hip | Laterality: Right

## 2015-06-18 ENCOUNTER — Encounter: Payer: Self-pay | Admitting: Gastroenterology

## 2015-08-14 ENCOUNTER — Ambulatory Visit: Payer: BLUE CROSS/BLUE SHIELD | Admitting: Gastroenterology

## 2015-09-11 ENCOUNTER — Other Ambulatory Visit (INDEPENDENT_AMBULATORY_CARE_PROVIDER_SITE_OTHER): Payer: BLUE CROSS/BLUE SHIELD

## 2015-09-11 ENCOUNTER — Ambulatory Visit (INDEPENDENT_AMBULATORY_CARE_PROVIDER_SITE_OTHER): Payer: BLUE CROSS/BLUE SHIELD | Admitting: Gastroenterology

## 2015-09-11 ENCOUNTER — Encounter: Payer: Self-pay | Admitting: Gastroenterology

## 2015-09-11 VITALS — BP 100/72 | HR 60 | Ht 65.0 in | Wt 160.6 lb

## 2015-09-11 DIAGNOSIS — K589 Irritable bowel syndrome without diarrhea: Secondary | ICD-10-CM | POA: Diagnosis not present

## 2015-09-11 DIAGNOSIS — Z800 Family history of malignant neoplasm of digestive organs: Secondary | ICD-10-CM | POA: Diagnosis not present

## 2015-09-11 LAB — COMPREHENSIVE METABOLIC PANEL
ALT: 33 U/L (ref 0–35)
AST: 20 U/L (ref 0–37)
Albumin: 4.2 g/dL (ref 3.5–5.2)
Alkaline Phosphatase: 114 U/L (ref 39–117)
BUN: 12 mg/dL (ref 6–23)
CHLORIDE: 103 meq/L (ref 96–112)
CO2: 29 meq/L (ref 19–32)
Calcium: 9.2 mg/dL (ref 8.4–10.5)
Creatinine, Ser: 1.17 mg/dL (ref 0.40–1.20)
GFR: 49.23 mL/min — ABNORMAL LOW (ref >60.00)
GLUCOSE: 83 mg/dL (ref 70–99)
POTASSIUM: 3.7 meq/L (ref 3.5–5.1)
SODIUM: 137 meq/L (ref 135–145)
TOTAL PROTEIN: 7.3 g/dL (ref 6.0–8.3)
Total Bilirubin: 0.4 mg/dL (ref 0.2–1.2)

## 2015-09-11 LAB — CBC WITH DIFFERENTIAL/PLATELET
Basophils Absolute: 0 10*3/uL (ref 0.0–0.1)
Basophils Relative: 0.6 % (ref 0.0–3.0)
EOS PCT: 2.1 % (ref 0.0–5.0)
Eosinophils Absolute: 0.2 10*3/uL (ref 0.0–0.7)
HCT: 37.3 % (ref 36.0–46.0)
Hemoglobin: 12.3 g/dL (ref 12.0–15.0)
LYMPHS ABS: 1.7 10*3/uL (ref 0.7–4.0)
Lymphocytes Relative: 22.5 % (ref 12.0–46.0)
MCHC: 33 g/dL (ref 30.0–36.0)
MCV: 88.9 fl (ref 78.0–100.0)
MONO ABS: 0.9 10*3/uL (ref 0.1–1.0)
Monocytes Relative: 12 % (ref 3.0–12.0)
NEUTROS PCT: 62.8 % (ref 43.0–77.0)
Neutro Abs: 4.8 10*3/uL (ref 1.4–7.7)
Platelets: 297 10*3/uL (ref 150.0–400.0)
RBC: 4.2 Mil/uL (ref 3.87–5.11)
RDW: 13.7 % (ref 11.5–15.5)
WBC: 7.7 10*3/uL (ref 4.0–10.5)

## 2015-09-11 LAB — IGA: IgA: 155 mg/dL (ref 68–378)

## 2015-09-11 MED ORDER — CHOLESTYRAMINE 4 G PO PACK: 4.0000 g | PACK | Freq: Every day | ORAL | 12 refills | Status: DC

## 2015-09-11 NOTE — Patient Instructions (Addendum)
We will get records sent from your previous gastroenterologist for review [facility]) This will include colonoscopy (4-5 years ago) and pathology results. Trial of cholestryamine powder.  Take one dose once daily. You will have labs checked today in the basement lab.  Please head down after you check out with the front desk  ( total IgA level, tTG, cbc, cmet, tsh).

## 2015-09-11 NOTE — Progress Notes (Signed)
HPI: This is a   Very pleasant 65 year old woman  Whom  I am meeting for the first time today  Chief complaint is  15-20 years of bloating, intermittent loose stools  Sister had colon cancer; diagnosed in her early 5560s.  Has been to other GI doctors (Dr. Jena Gaussourk and Dr. Teena DunkBenson).  She had a colonoscopy less than years go. Was told it was fine, no polyps or cancers.   She was told she has irritable bowel syndrome and gets antispasmodic medicines through her primary care physician periodically.  She has an umbilical hernia  She is bothered by bloating. This is constant.    Brisk gastrocolic reflex  Has gained 30 poiunds in 10 years.  Has had this problem for 15-20 years.  She had gallstones and her GB was removed about 10 years ago.  She never has normal stools, they are usually greasy, mucousy looking.  Range 1-5 times.    Never nocturnal  Diet not consistently the problem.  She tried gas ex, not much improvement.  Several weeks of dicyclomine;  No improvement    Review of systems: Pertinent positive and negative review of systems were noted in the above HPI section. Complete review of systems was performed and was otherwise normal.   Past Medical History:  Diagnosis Date  . Arthritis   . GERD (gastroesophageal reflux disease)   . Hepatitis   . Hypertension     Past Surgical History:  Procedure Laterality Date  . CHOLECYSTECTOMY    . COLONOSCOPY   03/27/2002   [id number]:Internal hemorrhoids; otherwise normal rectum./Normal colon and terminal ileum  . TOTAL HIP ARTHROPLASTY  10/28/2011   Procedure: TOTAL HIP ARTHROPLASTY;  Surgeon: Loanne DrillingFrank V Aluisio, MD;  Location: [facility];  Service: Orthopedics;  Laterality: Right;  . TUBAL LIGATION      Current Outpatient Prescriptions  Medication Sig Dispense Refill  . lisinopril (PRINIVIL,ZESTRIL) 10 MG tablet Take 10 mg by mouth every morning.    . ranitidine (ZANTAC) 150 MG tablet Take 150 mg by mouth 2 (two) times daily.    .  simvastatin (ZOCOR) 10 MG tablet Take 10 mg by mouth daily.     No current facility-administered medications for this visit.     Allergies as of 09/11/2015  . (No Known Allergies)    Family History  Problem Relation Age of Onset  . Lung cancer Father   . Breast cancer Sister   . Colon cancer Sister   . Crohn's disease Paternal Aunt     Social History   Social History  . Marital status: Married    Spouse name: N/A  . Number of children: 4  . Years of education: N/A   Occupational History  . [employer]    Social History Main Topics  . Smoking status: Never Smoker  . Smokeless tobacco: Never Used  . Alcohol use No  . Drug use: No  . Sexual activity: Not on file   Other Topics Concern  . Not on file   Social History Narrative  . No narrative on file     Physical Exam: BP 100/72 (BP Location: Left Arm, Patient Position: Sitting, Cuff Size: Normal)   Pulse 60   Ht 5\' 5"  (1.651 m)   Wt 160 lb 9.6 oz (72.8 kg)   BMI 26.73 kg/m  Constitutional: generally well-appearing Psychiatric: alert and oriented x3 Eyes: extraocular movements intact Mouth: oral pharynx moist, no lesions Neck: supple no lymphadenopathy Cardiovascular: heart regular rate and rhythm Lungs: clear to  auscultation bilaterally Abdomen: soft, nontender, nondistended, no obvious ascites, no peritoneal signs, normal bowel sounds Extremities: no lower extremity edema bilaterally Skin: no lesions on visible extremities   Assessment and plan: 65 y.o. female with   Bloating, diarrhea predominant IBS, family history of colon cancer   I suspect her symptoms are functional. Perhaps bile acid related diarrhea given her cholecystectomy many years ago. We will start a trial of cholestyramine powder which she will take once daily. We will also get records from her previous gastroenterologist, this will help determine the timing of her next colon cancer screening test with colonoscopy since her sister had colon  cancer in her early 37s. She will also get a basic set of labs today including celiac sprue testing, thyroid testing complete about profile and CBC.   Rob Bunting, MD [facility] 09/11/2015, 11:33 AM  Cc: Richardean Chimera, MD

## 2015-09-12 LAB — TSH: TSH: 3.02 u[IU]/mL (ref 0.35–4.50)

## 2015-09-12 LAB — TISSUE TRANSGLUTAMINASE, IGA: TISSUE TRANSGLUTAMINASE AB, IGA: 1 U/mL (ref <4)

## 2016-06-23 DIAGNOSIS — Z9189 Other specified personal risk factors, not elsewhere classified: Secondary | ICD-10-CM | POA: Diagnosis not present

## 2016-06-23 DIAGNOSIS — I10 Essential (primary) hypertension: Secondary | ICD-10-CM | POA: Diagnosis not present

## 2016-06-23 DIAGNOSIS — Z6826 Body mass index (BMI) 26.0-26.9, adult: Secondary | ICD-10-CM | POA: Diagnosis not present

## 2016-06-23 DIAGNOSIS — K219 Gastro-esophageal reflux disease without esophagitis: Secondary | ICD-10-CM | POA: Diagnosis not present

## 2016-06-23 DIAGNOSIS — Z1389 Encounter for screening for other disorder: Secondary | ICD-10-CM | POA: Diagnosis not present

## 2016-06-23 DIAGNOSIS — K589 Irritable bowel syndrome without diarrhea: Secondary | ICD-10-CM | POA: Diagnosis not present

## 2016-06-23 DIAGNOSIS — N183 Chronic kidney disease, stage 3 (moderate): Secondary | ICD-10-CM | POA: Diagnosis not present

## 2016-06-23 DIAGNOSIS — G43009 Migraine without aura, not intractable, without status migrainosus: Secondary | ICD-10-CM | POA: Diagnosis not present

## 2016-08-06 DIAGNOSIS — R05 Cough: Secondary | ICD-10-CM | POA: Diagnosis not present

## 2016-08-06 DIAGNOSIS — Z6827 Body mass index (BMI) 27.0-27.9, adult: Secondary | ICD-10-CM | POA: Diagnosis not present

## 2016-08-13 DIAGNOSIS — Z6826 Body mass index (BMI) 26.0-26.9, adult: Secondary | ICD-10-CM | POA: Diagnosis not present

## 2016-08-13 DIAGNOSIS — R05 Cough: Secondary | ICD-10-CM | POA: Diagnosis not present

## 2016-09-15 DIAGNOSIS — H31003 Unspecified chorioretinal scars, bilateral: Secondary | ICD-10-CM | POA: Diagnosis not present

## 2016-09-15 DIAGNOSIS — H35032 Hypertensive retinopathy, left eye: Secondary | ICD-10-CM | POA: Diagnosis not present

## 2016-09-15 DIAGNOSIS — H25013 Cortical age-related cataract, bilateral: Secondary | ICD-10-CM | POA: Diagnosis not present

## 2016-09-15 DIAGNOSIS — H353122 Nonexudative age-related macular degeneration, left eye, intermediate dry stage: Secondary | ICD-10-CM | POA: Diagnosis not present

## 2016-09-15 DIAGNOSIS — H2513 Age-related nuclear cataract, bilateral: Secondary | ICD-10-CM | POA: Diagnosis not present

## 2016-10-08 DIAGNOSIS — H353122 Nonexudative age-related macular degeneration, left eye, intermediate dry stage: Secondary | ICD-10-CM | POA: Diagnosis not present

## 2016-11-09 DIAGNOSIS — J209 Acute bronchitis, unspecified: Secondary | ICD-10-CM | POA: Diagnosis not present

## 2016-11-09 DIAGNOSIS — Z6827 Body mass index (BMI) 27.0-27.9, adult: Secondary | ICD-10-CM | POA: Diagnosis not present

## 2016-11-09 DIAGNOSIS — R05 Cough: Secondary | ICD-10-CM | POA: Diagnosis not present

## 2017-01-14 DIAGNOSIS — K210 Gastro-esophageal reflux disease with esophagitis: Secondary | ICD-10-CM | POA: Diagnosis not present

## 2017-01-14 DIAGNOSIS — Z9189 Other specified personal risk factors, not elsewhere classified: Secondary | ICD-10-CM | POA: Diagnosis not present

## 2017-01-14 DIAGNOSIS — I10 Essential (primary) hypertension: Secondary | ICD-10-CM | POA: Diagnosis not present

## 2017-01-14 DIAGNOSIS — N183 Chronic kidney disease, stage 3 (moderate): Secondary | ICD-10-CM | POA: Diagnosis not present

## 2017-01-19 DIAGNOSIS — N183 Chronic kidney disease, stage 3 (moderate): Secondary | ICD-10-CM | POA: Diagnosis not present

## 2017-01-19 DIAGNOSIS — I10 Essential (primary) hypertension: Secondary | ICD-10-CM | POA: Diagnosis not present

## 2017-01-19 DIAGNOSIS — K219 Gastro-esophageal reflux disease without esophagitis: Secondary | ICD-10-CM | POA: Diagnosis not present

## 2017-01-19 DIAGNOSIS — Z6827 Body mass index (BMI) 27.0-27.9, adult: Secondary | ICD-10-CM | POA: Diagnosis not present

## 2017-01-19 DIAGNOSIS — G43009 Migraine without aura, not intractable, without status migrainosus: Secondary | ICD-10-CM | POA: Diagnosis not present

## 2017-01-19 DIAGNOSIS — Z23 Encounter for immunization: Secondary | ICD-10-CM | POA: Diagnosis not present

## 2017-01-19 DIAGNOSIS — K589 Irritable bowel syndrome without diarrhea: Secondary | ICD-10-CM | POA: Diagnosis not present

## 2017-03-07 DIAGNOSIS — H353122 Nonexudative age-related macular degeneration, left eye, intermediate dry stage: Secondary | ICD-10-CM | POA: Diagnosis not present

## 2017-03-25 DIAGNOSIS — Z1231 Encounter for screening mammogram for malignant neoplasm of breast: Secondary | ICD-10-CM | POA: Diagnosis not present

## 2017-03-25 DIAGNOSIS — R928 Other abnormal and inconclusive findings on diagnostic imaging of breast: Secondary | ICD-10-CM | POA: Diagnosis not present

## 2017-03-31 DIAGNOSIS — N6312 Unspecified lump in the right breast, upper inner quadrant: Secondary | ICD-10-CM | POA: Diagnosis not present

## 2017-03-31 DIAGNOSIS — R928 Other abnormal and inconclusive findings on diagnostic imaging of breast: Secondary | ICD-10-CM | POA: Diagnosis not present

## 2017-05-03 DIAGNOSIS — R197 Diarrhea, unspecified: Secondary | ICD-10-CM | POA: Diagnosis not present

## 2017-05-03 DIAGNOSIS — Z6826 Body mass index (BMI) 26.0-26.9, adult: Secondary | ICD-10-CM | POA: Diagnosis not present

## 2017-05-03 DIAGNOSIS — R509 Fever, unspecified: Secondary | ICD-10-CM | POA: Diagnosis not present

## 2017-07-20 DIAGNOSIS — G43009 Migraine without aura, not intractable, without status migrainosus: Secondary | ICD-10-CM | POA: Diagnosis not present

## 2017-07-20 DIAGNOSIS — Z6827 Body mass index (BMI) 27.0-27.9, adult: Secondary | ICD-10-CM | POA: Diagnosis not present

## 2017-07-20 DIAGNOSIS — K219 Gastro-esophageal reflux disease without esophagitis: Secondary | ICD-10-CM | POA: Diagnosis not present

## 2017-07-20 DIAGNOSIS — Z0000 Encounter for general adult medical examination without abnormal findings: Secondary | ICD-10-CM | POA: Diagnosis not present

## 2017-07-20 DIAGNOSIS — K589 Irritable bowel syndrome without diarrhea: Secondary | ICD-10-CM | POA: Diagnosis not present

## 2017-07-20 DIAGNOSIS — I10 Essential (primary) hypertension: Secondary | ICD-10-CM | POA: Diagnosis not present

## 2017-07-20 DIAGNOSIS — N183 Chronic kidney disease, stage 3 (moderate): Secondary | ICD-10-CM | POA: Diagnosis not present

## 2017-09-10 DIAGNOSIS — M25551 Pain in right hip: Secondary | ICD-10-CM | POA: Diagnosis not present

## 2017-09-15 ENCOUNTER — Other Ambulatory Visit (HOSPITAL_COMMUNITY): Payer: Self-pay | Admitting: Student

## 2017-09-15 DIAGNOSIS — M25551 Pain in right hip: Secondary | ICD-10-CM

## 2017-09-28 ENCOUNTER — Encounter (HOSPITAL_COMMUNITY): Payer: Medicare Other

## 2017-10-05 ENCOUNTER — Encounter (HOSPITAL_COMMUNITY)
Admission: RE | Admit: 2017-10-05 | Discharge: 2017-10-05 | Disposition: A | Discharge status: Home / Self Care | Payer: Medicare Other | Source: Ambulatory Visit | Attending: Student | Admitting: Student

## 2017-10-05 ENCOUNTER — Ambulatory Visit (HOSPITAL_COMMUNITY)
Admission: RE | Admit: 2017-10-05 | Discharge: 2017-10-05 | Disposition: A | Discharge status: Home / Self Care | Payer: Medicare Other | Source: Ambulatory Visit | Attending: Student | Admitting: Student

## 2017-10-05 DIAGNOSIS — R937 Abnormal findings on diagnostic imaging of other parts of musculoskeletal system: Secondary | ICD-10-CM | POA: Diagnosis not present

## 2017-10-05 DIAGNOSIS — M25551 Pain in right hip: Secondary | ICD-10-CM | POA: Insufficient documentation

## 2017-10-05 DIAGNOSIS — Z96641 Presence of right artificial hip joint: Secondary | ICD-10-CM | POA: Diagnosis not present

## 2017-10-05 DIAGNOSIS — Z471 Aftercare following joint replacement surgery: Secondary | ICD-10-CM | POA: Diagnosis not present

## 2017-10-05 MED ORDER — TECHNETIUM TC 99M MEDRONATE IV KIT
20.3000 | PACK | Freq: Once | INTRAVENOUS | Status: AC | PRN
  Administered 2017-10-05: 20.3 via INTRAVENOUS

## 2017-10-20 DIAGNOSIS — R928 Other abnormal and inconclusive findings on diagnostic imaging of breast: Secondary | ICD-10-CM | POA: Diagnosis not present

## 2017-10-20 DIAGNOSIS — N6312 Unspecified lump in the right breast, upper inner quadrant: Secondary | ICD-10-CM | POA: Diagnosis not present

## 2017-11-30 ENCOUNTER — Ambulatory Visit (INDEPENDENT_AMBULATORY_CARE_PROVIDER_SITE_OTHER): Payer: Medicare Other | Admitting: General Surgery

## 2017-11-30 ENCOUNTER — Encounter: Payer: Self-pay | Admitting: General Surgery

## 2017-11-30 VITALS — BP 117/72 | HR 76 | Temp 97.5°F | Resp 18 | Wt 166.6 lb

## 2017-11-30 DIAGNOSIS — K432 Incisional hernia without obstruction or gangrene: Secondary | ICD-10-CM | POA: Diagnosis not present

## 2017-11-30 NOTE — H&P (Signed)
Lisa French; [id number]; 01/23/1950   HPI Patient is a 66-year-old white female who was referred to my care by Dr. Daniel for evaluation treatment of a ventral hernia.  Patient is status post a laparoscopic cholecystectomy in the remote past and has noted increased swelling and discomfort in the periumbilical region.  No nausea or vomiting have been noted.  She does state that occasionally she has had a hard knot develop in this region, but it resolved spontaneously.  No fever or chills have been noted.  It is made worse with straining.  She currently has 0 out of 10 abdominal pain. Past Medical History:  Diagnosis Date  . Arthritis   . GERD (gastroesophageal reflux disease)   . Hepatitis   . Hypertension     Past Surgical History:  Procedure Laterality Date  . CHOLECYSTECTOMY    . COLONOSCOPY   03/27/2002   RMR:Internal hemorrhoids; otherwise normal rectum./Normal colon and terminal ileum  . TOTAL HIP ARTHROPLASTY  10/28/2011   Procedure: TOTAL HIP ARTHROPLASTY;  Surgeon: Frank V Aluisio, MD;  Location: [facility];  Service: Orthopedics;  Laterality: Right;  . TUBAL LIGATION      Family History  Problem Relation Age of Onset  . Lung cancer Father   . Breast cancer Sister   . Colon cancer Sister   . Crohn's disease Paternal Aunt     Current Outpatient Medications on File Prior to Visit  Medication Sig Dispense Refill  . lisinopril (PRINIVIL,ZESTRIL) 10 MG tablet Take 10 mg by mouth every morning.    . pantoprazole (PROTONIX) 20 MG tablet Take 20 mg by mouth daily.    . simvastatin (ZOCOR) 10 MG tablet Take 10 mg by mouth daily.     No current facility-administered medications on file prior to visit.     No Known Allergies  Social History   Substance and Sexual Activity  Alcohol Use No    Social History   Tobacco Use  Smoking Status Never Smoker  Smokeless Tobacco Never Used    Review of Systems  Constitutional: Negative.   HENT: Negative.   Eyes: Negative.    Respiratory: Negative.   Cardiovascular: Negative.   Gastrointestinal: Positive for heartburn.  Genitourinary: Positive for frequency.  Musculoskeletal: Positive for joint pain.  Skin: Negative.   Neurological: Negative.   Endo/Heme/Allergies: Negative.   Psychiatric/Behavioral: Negative.     Objective   Vitals:   11/30/17 1051  BP: 117/72  Pulse: 76  Resp: 18  Temp: (!) 97.5 F (36.4 C)    Physical Exam  Constitutional: She is oriented to person, place, and time. She appears well-developed and well-nourished. No distress.  HENT:  Head: Normocephalic and atraumatic.  Cardiovascular: Normal rate, regular rhythm and normal heart sounds. Exam reveals no gallop and no friction rub.  No murmur heard. Pulmonary/Chest: Effort normal and breath sounds normal. No stridor. No respiratory distress. She has no wheezes. She has no rales.  Abdominal: Soft. Bowel sounds are normal. She exhibits no distension and no mass. There is no tenderness. There is no guarding. A hernia is present.  Vertical surgical scar at the umbilicus with an umbilical hernia as well as incisional hernia present.  Both are easily reducible.  Some thin skin is noted at the umbilicus.  Neurological: She is alert and oriented to person, place, and time.  Skin: Skin is warm and dry.  Vitals reviewed.   Assessment  Incisional hernia Plan   Patient is scheduled for incisional herniorrhaphy with mesh on   12/15/2017.  The risks and benefits of the procedure including bleeding, infection, mesh use, and the possibility of recurrence of the hernia were fully explained to the patient, who gave informed consent. 

## 2017-11-30 NOTE — Progress Notes (Signed)
Lisa French; [id number]; [birth year]   HPI Patient is a 67 year old white female who was referred to my care by Dr. Reuel Boom for evaluation treatment of a ventral hernia.  Patient is status post a laparoscopic cholecystectomy in the remote past and has noted increased swelling and discomfort in the periumbilical region.  No nausea or vomiting have been noted.  She does state that occasionally she has had a hard knot develop in this region, but it resolved spontaneously.  No fever or chills have been noted.  It is made worse with straining.  She currently has 0 out of 10 abdominal pain. Past Medical History:  Diagnosis Date  . Arthritis   . GERD (gastroesophageal reflux disease)   . Hepatitis   . Hypertension     Past Surgical History:  Procedure Laterality Date  . CHOLECYSTECTOMY    . COLONOSCOPY   03/27/2002   [id number] hemorrhoids; otherwise normal rectum./Normal colon and terminal ileum  . TOTAL HIP ARTHROPLASTY  10/28/2011   Procedure: TOTAL HIP ARTHROPLASTY;  Surgeon: Loanne Drilling, MD;  Location: [facility];  Service: Orthopedics;  Laterality: Right;  . TUBAL LIGATION      Family History  Problem Relation Age of Onset  . Lung cancer Father   . Breast cancer Sister   . Colon cancer Sister   . Crohn's disease Paternal Aunt     Current Outpatient Medications on File Prior to Visit  Medication Sig Dispense Refill  . lisinopril (PRINIVIL,ZESTRIL) 10 MG tablet Take 10 mg by mouth every morning.    . pantoprazole (PROTONIX) 20 MG tablet Take 20 mg by mouth daily.    . simvastatin (ZOCOR) 10 MG tablet Take 10 mg by mouth daily.     No current facility-administered medications on file prior to visit.     No Known Allergies  Social History   Substance and Sexual Activity  Alcohol Use No    Social History   Tobacco Use  Smoking Status Never Smoker  Smokeless Tobacco Never Used    Review of Systems  Constitutional: Negative.   HENT: Negative.   Eyes: Negative.    Respiratory: Negative.   Cardiovascular: Negative.   Gastrointestinal: Positive for heartburn.  Genitourinary: Positive for frequency.  Musculoskeletal: Positive for joint pain.  Skin: Negative.   Neurological: Negative.   Endo/Heme/Allergies: Negative.   Psychiatric/Behavioral: Negative.     Objective   Vitals:   11/30/17 1051  BP: 117/72  Pulse: 76  Resp: 18  Temp: (!) 97.5 F (36.4 C)    Physical Exam  Constitutional: She is oriented to person, place, and time. She appears well-developed and well-nourished. No distress.  HENT:  Head: Normocephalic and atraumatic.  Cardiovascular: Normal rate, regular rhythm and normal heart sounds. Exam reveals no gallop and no friction rub.  No murmur heard. Pulmonary/Chest: Effort normal and breath sounds normal. No stridor. No respiratory distress. She has no wheezes. She has no rales.  Abdominal: Soft. Bowel sounds are normal. She exhibits no distension and no mass. There is no tenderness. There is no guarding. A hernia is present.  Vertical surgical scar at the umbilicus with an umbilical hernia as well as incisional hernia present.  Both are easily reducible.  Some thin skin is noted at the umbilicus.  Neurological: She is alert and oriented to person, place, and time.  Skin: Skin is warm and dry.  Vitals reviewed.   Assessment  Incisional hernia Plan   Patient is scheduled for incisional herniorrhaphy with mesh on  12/15/2017.  The risks and benefits of the procedure including bleeding, infection, mesh use, and the possibility of recurrence of the hernia were fully explained to the patient, who gave informed consent.

## 2017-11-30 NOTE — Patient Instructions (Signed)
 Ventral Hernia A ventral hernia is a bulge of tissue from inside the abdomen that pushes through a weak area of the muscles that form the front wall of the abdomen. The tissues inside the abdomen are inside a sac (peritoneum). These tissues include the small intestine, large intestine, and the fatty tissue that covers the intestines (omentum). Sometimes, the bulge that forms a hernia contains intestines. Other hernias contain only fat. Ventral hernias do not go away without surgical treatment. There are several types of ventral hernias. You may have:  A hernia at an incision site from previous abdominal surgery (incisional hernia).  A hernia just above the belly button (epigastric hernia), or at the belly button (umbilical hernia). These types of hernias can develop from heavy lifting or straining.  A hernia that comes and goes (reducible hernia). It may be visible only when you lift or strain. This type of hernia can be pushed back into the abdomen (reduced).  A hernia that traps abdominal tissue inside the hernia (incarcerated hernia). This type of hernia does not reduce.  A hernia that cuts off blood flow to the tissues inside the hernia (strangulated hernia). The tissues can start to die if this happens. This is a very painful bulge that cannot be reduced. A strangulated hernia is a medical emergency.  What are the causes? This condition is caused by abdominal tissue putting pressure on an area of weakness in the abdominal muscles. What increases the risk? The following factors may make you more likely to develop this condition:  Being female.  Being 60 or older.  Being overweight or obese.  Having had previous abdominal surgery, especially if there was an infection after surgery.  Having had an injury to the abdominal wall.  Having had several pregnancies.  Having a buildup of fluid inside the abdomen (ascites).  What are the signs or symptoms? The only symptom of a ventral  hernia may be a painless bulge in the abdomen. A reducible hernia may be visible only when you strain, cough, or lift. Other symptoms may include:  Dull pain.  A feeling of pressure.  Signs and symptoms of a strangulated hernia may include:  Increasing pain.  Nausea and vomiting.  Pain when pressing on the hernia.  The skin over the hernia turning red or purple.  Constipation.  Blood in the stool (feces).  How is this diagnosed? This condition may be diagnosed based on:  Your symptoms.  Your medical history.  A physical exam. You may be asked to cough or strain while standing. These actions increase the pressure inside your abdomen and force the hernia through the opening in your muscles. Your health care provider may try to reduce the hernia by pressing on it.  Imaging studies, such as an ultrasound or CT scan.  How is this treated? This condition is treated with surgery. If you have a strangulated hernia, surgery is done as soon as possible. If your hernia is small and not incarcerated, you may be asked to lose some weight before surgery. Follow these instructions at home:  Follow instructions from your health care provider about eating or drinking restrictions.  If you are overweight, your health care provider may recommend that you increase your activity level and eat a healthier diet.  Do not lift anything that is heavier than 10 lb (4.5 kg).  Return to your normal activities as told by your health care provider. Ask your health care provider what activities are safe for you. You   may need to avoid activities that increase pressure on your hernia.  Take over-the-counter and prescription medicines only as told by your health care provider.  Keep all follow-up visits as told by your health care provider. This is important. Contact a health care provider if:  Your hernia gets larger.  Your hernia becomes painful. Get help right away if:  Your hernia becomes  increasingly painful.  You have pain along with any of the following: ? Changes in skin color in the area of the hernia. ? Nausea. ? Vomiting. ? Fever. Summary  A ventral hernia is a bulge of tissue from inside the abdomen that pushes through a weak area of the muscles that form the front wall of the abdomen.  This condition is treated with surgery, which may be urgent depending on your hernia.  Do not lift anything that is heavier than 10 lb (4.5 kg), and follow activity instructions from your health care provider. This information is not intended to replace advice given to you by your health care provider. Make sure you discuss any questions you have with your health care provider. Document Released: 12/09/2011 Document Revised: 08/09/2015 Document Reviewed: 08/09/2015 [employer] Interactive Patient Education  2018 [employer]  

## 2017-12-07 NOTE — Patient Instructions (Signed)
Lisa French  12/07/2017     @PREFPERIOPPHARMACY @   Your procedure is scheduled on  12/15/2017  Report to [facility] at  700   A.M.  Call this number if you have problems the morning of surgery:  [phone]   Remember:  Do not eat or drink after midnight.                       Take these medicines the morning of surgery with A SIP OF WATER  Lisinopril, protonix.    Do not wear jewelry, make-up or nail polish.  Do not wear lotions, powders, or perfumes, or deodorant.  Do not shave 48 hours prior to surgery.  Men may shave face and neck.  Do not bring valuables to the hospital.  [facility] is not responsible for any belongings or valuables.  Contacts, dentures or bridgework may not be worn into surgery.  Leave your suitcase in the car.  After surgery it may be brought to your room.  For patients admitted to the hospital, discharge time will be determined by your treatment team.  Patients discharged the day of surgery will not be allowed to drive home.   Name and phone number of your driver:   family Special instructions:  None  Please read over the following fact sheets that you were given. Anesthesia Post-op Instructions and Care and Recovery After Surgery       Open Hernia Repair, Adult Open hernia repair is a surgical procedure to fix a hernia. A hernia occurs when an internal organ or tissue pushes out through a weak spot in the abdominal wall muscles. Hernias commonly occur in the groin and around the navel. Most hernias tend to get worse over time. Often, surgery is done to prevent the hernia from becoming bigger, uncomfortable, or an emergency. Emergency surgery may be needed if abdominal contents get stuck in the opening (incarcerated hernia) or the blood supply gets cut off (strangulated hernia). In an open repair, an incision is made in the abdomen to perform the surgery. Tell a health care provider about:  Any allergies you have.  All  medicines you are taking, including vitamins, herbs, eye drops, creams, and over-the-counter medicines.  Any problems you or family members have had with anesthetic medicines.  Any blood or bone disorders you have.  Any surgeries you have had.  Any medical conditions you have, including any recent cold or flu symptoms.  Whether you are pregnant or may be pregnant. What are the risks? Generally, this is a safe procedure. However, problems may occur, including:  Long-lasting (chronic) pain.  Bleeding.  Infection.  Damage to the testicle. This can cause shrinking or swelling.  Damage to the bladder, blood vessels, intestine, or nerves near the hernia.  Trouble passing urine.  Allergic reactions to medicines.  Return of the hernia.  What happens before the procedure? Staying hydrated Follow instructions from your health care provider about hydration, which may include:  Up to 2 hours before the procedure - you may continue to drink clear liquids, such as water, clear fruit juice, black coffee, and plain tea.  Eating and drinking restrictions Follow instructions from your health care provider about eating and drinking, which may include:  8 hours before the procedure - stop eating heavy meals or foods such as meat, fried foods, or fatty foods.  6 hours before the procedure - stop eating light meals  or foods, such as toast or cereal.  6 hours before the procedure - stop drinking milk or drinks that contain milk.  2 hours before the procedure - stop drinking clear liquids.  Medicines  Ask your health care provider about: ? Changing or stopping your regular medicines. This is especially important if you are taking diabetes medicines or blood thinners. ? Taking medicines such as aspirin and ibuprofen. These medicines can thin your blood. Do not take these medicines before your procedure if your health care provider instructs you not to.  You may be given antibiotic  medicine to help prevent infection. General instructions  You may have blood tests or imaging studies.  Ask your health care provider how your surgical site will be marked or identified.  If you smoke, do not smoke for at least 2 weeks before your procedure or for as long as told by your health care provider.  Let your health care provider know if you develop a cold or any infection before your surgery.  Plan to have someone take you home from the hospital or clinic.  If you will be going home right after the procedure, plan to have someone with you for 24 hours. What happens during the procedure?  To reduce your risk of infection: ? Your health care team will wash or sanitize their hands. ? Your skin will be washed with soap. ? Hair may be removed from the surgical area.  An IV tube will be inserted into one of your veins.  You will be given one or more of the following: ? A medicine to help you relax (sedative). ? A medicine to numb the area (local anesthetic). ? A medicine to make you fall asleep (general anesthetic).  Your surgeon will make an incision over the hernia.  The tissues of the hernia will be moved back into place.  The edges of the hernia may be stitched together.  The opening in the abdominal muscles will be closed with stitches (sutures). Or, your surgeon will place a mesh patch made of manmade (synthetic) material over the opening.  The incision will be closed.  A bandage (dressing) may be placed over the incision. The procedure may vary among health care providers and hospitals. What happens after the procedure?  Your blood pressure, heart rate, breathing rate, and blood oxygen level will be monitored until the medicines you were given have worn off.  You may be given medicine for pain.  Do not drive for 24 hours if you received a sedative. This information is not intended to replace advice given to you by your health care provider. Make sure you  discuss any questions you have with your health care provider. Document Released: 06/17/2000 Document Revised: 07/12/2015 Document Reviewed: 06/05/2015 Elsevier Interactive Patient Education  2018 [employer]  Open Hernia Repair, Adult, Care After These instructions give you information about caring for yourself after your procedure. Your doctor may also give you more specific instructions. If you have problems or questions, contact your doctor. Follow these instructions at home: Surgical cut (incision) care   Follow instructions from your doctor about how to take care of your surgical cut area. Make sure you: ? Wash your hands with soap and water before you change your bandage (dressing). If you cannot use soap and water, use hand sanitizer. ? Change your bandage as told by your doctor. ? Leave stitches (sutures), skin glue, or skin tape (adhesive) strips in place. They may need to stay in place  for 2 weeks or longer. If tape strips get loose and curl up, you may trim the loose edges. Do not remove tape strips completely unless your doctor says it is okay.  Check your surgical cut every day for signs of infection. Check for: ? More redness, swelling, or pain. ? More fluid or blood. ? Warmth. ? Pus or a bad smell. Activity  Do not drive or use heavy machinery while taking prescription pain medicine. Do not drive until your doctor says it is okay.  Until your doctor says it is okay: ? Do not lift anything that is heavier than 10 lb (4.5 kg). ? Do not play contact sports.  Return to your normal activities as told by your doctor. Ask your doctor what activities are safe. General instructions  To prevent or treat having a hard time pooping (constipation) while you are taking prescription pain medicine, your doctor may recommend that you: ? Drink enough fluid to keep your pee (urine) clear or pale yellow. ? Take over-the-counter or prescription medicines. ? Eat foods that are high in  fiber, such as fresh fruits and vegetables, whole grains, and beans. ? Limit foods that are high in fat and processed sugars, such as fried and sweet foods.  Take over-the-counter and prescription medicines only as told by your doctor.  Do not take baths, swim, or use a hot tub until your doctor says it is okay.  Keep all follow-up visits as told by your doctor. This is important. Contact a doctor if:  You develop a rash.  You have more redness, swelling, or pain around your surgical cut.  You have more fluid or blood coming from your surgical cut.  Your surgical cut feels warm to the touch.  You have pus or a bad smell coming from your surgical cut.  You have a fever or chills.  You have blood in your poop (stool).  You have not pooped in 2-3 days.  Medicine does not help your pain. Get help right away if:  You have chest pain or you are short of breath.  You feel light-headed.  You feel weak and dizzy (feel faint).  You have very bad pain.  You throw up (vomit) and your pain is worse. This information is not intended to replace advice given to you by your health care provider. Make sure you discuss any questions you have with your health care provider. Document Released: 01/12/2014 Document Revised: 07/12/2015 Document Reviewed: 06/05/2015 Elsevier Interactive Patient Education  2018 [employer].  General Anesthesia, Adult General anesthesia is the use of medicines to make a person "go to sleep" (be unconscious) for a medical procedure. General anesthesia is often recommended when a procedure:  Is long.  Requires you to be still or in an unusual position.  Is major and can cause you to lose blood.  Is impossible to do without general anesthesia.  The medicines used for general anesthesia are called general anesthetics. In addition to making you sleep, the medicines:  Prevent pain.  Control your blood pressure.  Relax your muscles.  Tell a health care  provider about:  Any allergies you have.  All medicines you are taking, including vitamins, herbs, eye drops, creams, and over-the-counter medicines.  Any problems you or family members have had with anesthetic medicines.  Types of anesthetics you have had in the past.  Any bleeding disorders you have.  Any surgeries you have had.  Any medical conditions you have.  Any history of heart  or lung conditions, such as heart failure, sleep apnea, or chronic obstructive pulmonary disease (COPD).  Whether you are pregnant or may be pregnant.  Whether you use tobacco, alcohol, marijuana, or street drugs.  Any history of Financial pl[employer].  Any history of depression or anxiety. What are the risks? Generally, this is a safe procedure. However, problems may occur, including:  Allergic reaction to anesthetics.  Lung and heart problems.  Inhaling food or liquids from your stomach into your lungs (aspiration).  Injury to nerves.  Waking up during your procedure and being unable to move (rare).  Extreme agitation or a state of mental confusion (delirium) when you wake up from the anesthetic.  Air in the bloodstream, which can lead to stroke.  These problems are more likely to develop if you are having a major surgery or if you have an advanced medical condition. You can prevent some of these complications by answering all of your health care provider's questions thoroughly and by following all pre-procedure instructions. General anesthesia can cause side effects, including:  Nausea or vomiting  A sore throat from the breathing tube.  Feeling cold or shivery.  Feeling tired, washed out, or achy.  Sleepiness or drowsiness.  Confusion or agitation.  What happens before the procedure? Staying hydrated Follow instructions from your health care provider about hydration, which may include:  Up to 2 hours before the procedure - you may continue to drink clear liquids, such as  water, clear fruit juice, black coffee, and plain tea.  Eating and drinking restrictions Follow instructions from your health care provider about eating and drinking, which may include:  8 hours before the procedure - stop eating heavy meals or foods such as meat, fried foods, or fatty foods.  6 hours before the procedure - stop eating light meals or foods, such as toast or cereal.  6 hours before the procedure - stop drinking milk or drinks that contain milk.  2 hours before the procedure - stop drinking clear liquids.  Medicines  Ask your health care provider about: ? Changing or stopping your regular medicines. This is especially important if you are taking diabetes medicines or blood thinners. ? Taking medicines such as aspirin and ibuprofen. These medicines can thin your blood. Do not take these medicines before your procedure if your health care provider instructs you not to. ? Taking new dietary supplements or medicines. Do not take these during the week before your procedure unless your health care provider approves them.  If you are told to take a medicine or to continue taking a medicine on the day of the procedure, take the medicine with sips of water. General instructions   Ask if you will be going home the same day, the following day, or after a longer hospital stay. ? Plan to have someone take you home. ? Plan to have someone stay with you for the first 24 hours after you leave the hospital or clinic.  For 3-6 weeks before the procedure, try not to use any tobacco products, such as cigarettes, chewing tobacco, and e-cigarettes.  You may brush your teeth on the morning of the procedure, but make sure to spit out the toothpaste. What happens during the procedure?  You will be given anesthetics through a mask and through an IV tube in one of your veins.  You may receive medicine to help you relax (sedative).  As soon as you are asleep, a breathing tube may be used to  help you breathe.  An anesthesia specialist will stay with you throughout the procedure. He or she will help keep you comfortable and safe by continuing to give you medicines and adjusting the amount of medicine that you get. He or she will also watch your blood pressure, pulse, and oxygen levels to make sure that the anesthetics do not cause any problems.  If a breathing tube was used to help you breathe, it will be removed before you wake up. The procedure may vary among health care providers and hospitals. What happens after the procedure?  You will wake up, often slowly, after the procedure is complete, usually in a recovery area.  Your blood pressure, heart rate, breathing rate, and blood oxygen level will be monitored until the medicines you were given have worn off.  You may be given medicine to help you calm down if you feel anxious or agitated.  If you will be going home the same day, your health care provider may check to make sure you can stand, drink, and urinate.  Your health care providers will treat your pain and side effects before you go home.  Do not drive for 24 hours if you received a sedative.  You may: ? Feel nauseous and vomit. ? Have a sore throat. ? Have mental slowness. ? Feel cold or shivery. ? Feel sleepy. ? Feel tired. ? Feel sore or achy, even in parts of your body where you did not have surgery. This information is not intended to replace advice given to you by your health care provider. Make sure you discuss any questions you have with your health care provider. Document Released: 03/31/2007 Document Revised: 06/04/2015 Document Reviewed: 12/06/2014 Elsevier Interactive Patient Education  2018 [employer]. General Anesthesia, Adult, Care After These instructions provide you with information about caring for yourself after your procedure. Your health care provider may also give you more specific instructions. Your treatment has been planned according  to current medical practices, but problems sometimes occur. Call your health care provider if you have any problems or questions after your procedure. What can I expect after the procedure? After the procedure, it is common to have:  Vomiting.  A sore throat.  Mental slowness.  It is common to feel:  Nauseous.  Cold or shivery.  Sleepy.  Tired.  Sore or achy, even in parts of your body where you did not have surgery.  Follow these instructions at home: For at least 24 hours after the procedure:  Do not: ? Participate in activities where you could fall or become injured. ? Drive. ? Use heavy machinery. ? Drink alcohol. ? Take sleeping pills or medicines that cause drowsiness. ? Make important decisions or sign legal documents. ? Take care of children on your own.  Rest. Eating and drinking  If you vomit, drink water, juice, or soup when you can drink without vomiting.  Drink enough fluid to keep your urine clear or pale yellow.  Make sure you have little or no nausea before eating solid foods.  Follow the diet recommended by your health care provider. General instructions  Have a responsible adult stay with you until you are awake and alert.  Return to your normal activities as told by your health care provider. Ask your health care provider what activities are safe for you.  Take over-the-counter and prescription medicines only as told by your health care provider.  If you smoke, do not smoke without supervision.  Keep all follow-up visits as told by your health  care provider. This is important. Contact a health care provider if:  You continue to have nausea or vomiting at home, and medicines are not helpful.  You cannot drink fluids or start eating again.  You cannot urinate after 8-12 hours.  You develop a skin rash.  You have fever.  You have increasing redness at the site of your procedure. Get help right away if:  You have difficulty  breathing.  You have chest pain.  You have unexpected bleeding.  You feel that you are having a life-threatening or urgent problem. This information is not intended to replace advice given to you by your health care provider. Make sure you discuss any questions you have with your health care provider. Document Released: 03/30/2000 Document Revised: 05/27/2015 Document Reviewed: 12/06/2014 Elsevier Interactive Patient Education  [employer].

## 2017-12-09 DIAGNOSIS — Z124 Encounter for screening for malignant neoplasm of cervix: Secondary | ICD-10-CM | POA: Diagnosis not present

## 2017-12-09 DIAGNOSIS — R300 Dysuria: Secondary | ICD-10-CM | POA: Diagnosis not present

## 2017-12-09 DIAGNOSIS — Z01419 Encounter for gynecological examination (general) (routine) without abnormal findings: Secondary | ICD-10-CM | POA: Diagnosis not present

## 2017-12-13 ENCOUNTER — Encounter (HOSPITAL_COMMUNITY): Payer: Self-pay

## 2017-12-13 ENCOUNTER — Other Ambulatory Visit: Payer: Self-pay

## 2017-12-13 ENCOUNTER — Encounter (HOSPITAL_COMMUNITY)
Admission: RE | Admit: 2017-12-13 | Discharge: 2017-12-13 | Disposition: A | Discharge status: Home / Self Care | Payer: Medicare Other | Source: Ambulatory Visit | Attending: General Surgery | Admitting: General Surgery

## 2017-12-13 DIAGNOSIS — N3000 Acute cystitis without hematuria: Secondary | ICD-10-CM | POA: Diagnosis not present

## 2017-12-13 DIAGNOSIS — Z01818 Encounter for other preprocedural examination: Secondary | ICD-10-CM | POA: Insufficient documentation

## 2017-12-13 DIAGNOSIS — Z96641 Presence of right artificial hip joint: Secondary | ICD-10-CM | POA: Diagnosis not present

## 2017-12-13 DIAGNOSIS — Z79899 Other long term (current) drug therapy: Secondary | ICD-10-CM | POA: Diagnosis not present

## 2017-12-13 DIAGNOSIS — D649 Anemia, unspecified: Secondary | ICD-10-CM | POA: Diagnosis not present

## 2017-12-13 DIAGNOSIS — K219 Gastro-esophageal reflux disease without esophagitis: Secondary | ICD-10-CM | POA: Diagnosis not present

## 2017-12-13 DIAGNOSIS — N83202 Unspecified ovarian cyst, left side: Secondary | ICD-10-CM | POA: Diagnosis not present

## 2017-12-13 DIAGNOSIS — K432 Incisional hernia without obstruction or gangrene: Secondary | ICD-10-CM | POA: Diagnosis not present

## 2017-12-13 DIAGNOSIS — R109 Unspecified abdominal pain: Secondary | ICD-10-CM | POA: Diagnosis not present

## 2017-12-13 DIAGNOSIS — R102 Pelvic and perineal pain: Secondary | ICD-10-CM | POA: Diagnosis not present

## 2017-12-13 DIAGNOSIS — I10 Essential (primary) hypertension: Secondary | ICD-10-CM | POA: Diagnosis not present

## 2017-12-13 HISTORY — DX: Other complications of anesthesia, initial encounter: T88.59XA

## 2017-12-13 HISTORY — DX: Adverse effect of unspecified anesthetic, initial encounter: T41.45XA

## 2017-12-13 LAB — CBC WITH DIFFERENTIAL/PLATELET
Abs Immature Granulocytes: 0.03 10*3/uL (ref 0.00–0.07)
Basophils Absolute: 0.1 10*3/uL (ref 0.0–0.1)
Basophils Relative: 1 %
Eosinophils Absolute: 0.2 10*3/uL (ref 0.0–0.5)
Eosinophils Relative: 2 %
HEMATOCRIT: 36.4 % (ref 36.0–46.0)
Hemoglobin: 11.2 g/dL — ABNORMAL LOW (ref 12.0–15.0)
Immature Granulocytes: 0 %
Lymphocytes Relative: 20 %
Lymphs Abs: 1.7 10*3/uL (ref 0.7–4.0)
MCH: 29.1 pg (ref 26.0–34.0)
MCHC: 30.8 g/dL (ref 30.0–36.0)
MCV: 94.5 fL (ref 80.0–100.0)
MONO ABS: 0.7 10*3/uL (ref 0.1–1.0)
Monocytes Relative: 9 %
Neutro Abs: 5.5 10*3/uL (ref 1.7–7.7)
Neutrophils Relative %: 68 %
Platelets: 292 10*3/uL (ref 150–400)
RBC: 3.85 MIL/uL — ABNORMAL LOW (ref 3.87–5.11)
RDW: 13 % (ref 11.5–15.5)
WBC: 8.2 10*3/uL (ref 4.0–10.5)
nRBC: 0 % (ref 0.0–0.2)

## 2017-12-13 LAB — BASIC METABOLIC PANEL
Anion gap: 7 (ref 5–15)
BUN: 15 mg/dL (ref 8–23)
CO2: 27 mmol/L (ref 22–32)
Calcium: 9 mg/dL (ref 8.9–10.3)
Chloride: 105 mmol/L (ref 98–111)
Creatinine, Ser: 1.21 mg/dL — ABNORMAL HIGH (ref 0.44–1.00)
GFR calc Af Amer: 54 mL/min — ABNORMAL LOW (ref >60)
GFR calc non Af Amer: 46 mL/min — ABNORMAL LOW (ref >60)
GLUCOSE: 103 mg/dL — AB (ref 70–99)
Potassium: 3.1 mmol/L — ABNORMAL LOW (ref 3.5–5.1)
Sodium: 139 mmol/L (ref 135–145)

## 2017-12-14 DIAGNOSIS — R102 Pelvic and perineal pain: Secondary | ICD-10-CM | POA: Diagnosis not present

## 2017-12-15 ENCOUNTER — Ambulatory Visit (HOSPITAL_COMMUNITY)
Admission: RE | Admit: 2017-12-15 | Discharge: 2017-12-15 | Disposition: A | Discharge status: Home / Self Care | Payer: Medicare Other | Source: Ambulatory Visit | Attending: General Surgery | Admitting: General Surgery

## 2017-12-15 ENCOUNTER — Encounter (HOSPITAL_COMMUNITY)
Admission: RE | Disposition: A | Discharge status: Home / Self Care | Payer: Self-pay | Source: Ambulatory Visit | Attending: General Surgery

## 2017-12-15 ENCOUNTER — Other Ambulatory Visit: Payer: Self-pay

## 2017-12-15 ENCOUNTER — Ambulatory Visit (HOSPITAL_COMMUNITY): Payer: Medicare Other | Admitting: Anesthesiology

## 2017-12-15 ENCOUNTER — Encounter (HOSPITAL_COMMUNITY): Payer: Self-pay

## 2017-12-15 DIAGNOSIS — Z96641 Presence of right artificial hip joint: Secondary | ICD-10-CM | POA: Diagnosis not present

## 2017-12-15 DIAGNOSIS — I10 Essential (primary) hypertension: Secondary | ICD-10-CM | POA: Diagnosis not present

## 2017-12-15 DIAGNOSIS — K219 Gastro-esophageal reflux disease without esophagitis: Secondary | ICD-10-CM | POA: Insufficient documentation

## 2017-12-15 DIAGNOSIS — D649 Anemia, unspecified: Secondary | ICD-10-CM | POA: Diagnosis not present

## 2017-12-15 DIAGNOSIS — K432 Incisional hernia without obstruction or gangrene: Secondary | ICD-10-CM

## 2017-12-15 DIAGNOSIS — Z79899 Other long term (current) drug therapy: Secondary | ICD-10-CM | POA: Insufficient documentation

## 2017-12-15 HISTORY — PX: INCISIONAL HERNIA REPAIR: SHX193

## 2017-12-15 SURGERY — REPAIR, HERNIA, INCISIONAL
Anesthesia: General | Site: Abdomen

## 2017-12-15 MED ORDER — ONDANSETRON HCL 4 MG/2ML IJ SOLN
INTRAMUSCULAR | Status: AC
  Filled 2017-12-15: qty 2

## 2017-12-15 MED ORDER — CHLORHEXIDINE GLUCONATE CLOTH 2 % EX PADS: 6.0000 | MEDICATED_PAD | Freq: Once | CUTANEOUS | Status: DC

## 2017-12-15 MED ORDER — PROMETHAZINE HCL 25 MG/ML IJ SOLN
6.2500 mg | INTRAMUSCULAR | Status: AC | PRN
  Administered 2017-12-15 (×2): 6.25 mg via INTRAVENOUS
  Filled 2017-12-15: qty 1

## 2017-12-15 MED ORDER — ROCURONIUM BROMIDE 10 MG/ML (PF) SYRINGE
PREFILLED_SYRINGE | INTRAVENOUS | Status: AC
  Filled 2017-12-15: qty 10

## 2017-12-15 MED ORDER — 0.9 % SODIUM CHLORIDE (POUR BTL) OPTIME
TOPICAL | Status: DC | PRN
  Administered 2017-12-15: 1000 mL

## 2017-12-15 MED ORDER — BUPIVACAINE LIPOSOME 1.3 % IJ SUSP
INTRAMUSCULAR | Status: AC
  Filled 2017-12-15: qty 20

## 2017-12-15 MED ORDER — POVIDONE-IODINE 10 % EX OINT
TOPICAL_OINTMENT | CUTANEOUS | Status: AC
  Filled 2017-12-15: qty 1

## 2017-12-15 MED ORDER — PHENYLEPHRINE HCL 10 MG/ML IJ SOLN
INTRAMUSCULAR | Status: DC | PRN
  Administered 2017-12-15 (×2): 80 ug via INTRAVENOUS

## 2017-12-15 MED ORDER — LACTATED RINGERS IV SOLN
INTRAVENOUS | Status: DC
  Administered 2017-12-15: 09:00:00 via INTRAVENOUS
  Administered 2017-12-15: 1000 mL via INTRAVENOUS

## 2017-12-15 MED ORDER — DEXAMETHASONE SODIUM PHOSPHATE 10 MG/ML IJ SOLN
INTRAMUSCULAR | Status: AC
  Filled 2017-12-15: qty 1

## 2017-12-15 MED ORDER — LIDOCAINE HCL (CARDIAC) PF 50 MG/5ML IV SOSY
PREFILLED_SYRINGE | INTRAVENOUS | Status: DC | PRN
  Administered 2017-12-15: 40 mg via INTRAVENOUS

## 2017-12-15 MED ORDER — PROPOFOL 10 MG/ML IV BOLUS
INTRAVENOUS | Status: AC
  Filled 2017-12-15: qty 20

## 2017-12-15 MED ORDER — ONDANSETRON HCL 4 MG/2ML IJ SOLN
4.0000 mg | Freq: Once | INTRAMUSCULAR | Status: AC
  Administered 2017-12-15: 4 mg via INTRAVENOUS

## 2017-12-15 MED ORDER — DEXAMETHASONE SODIUM PHOSPHATE 4 MG/ML IJ SOLN
INTRAMUSCULAR | Status: DC | PRN
  Administered 2017-12-15: 8 mg via INTRAVENOUS

## 2017-12-15 MED ORDER — FENTANYL CITRATE (PF) 100 MCG/2ML IJ SOLN
INTRAMUSCULAR | Status: DC | PRN
  Administered 2017-12-15 (×4): 50 ug via INTRAVENOUS

## 2017-12-15 MED ORDER — FENTANYL CITRATE (PF) 100 MCG/2ML IJ SOLN
INTRAMUSCULAR | Status: AC
  Filled 2017-12-15: qty 2

## 2017-12-15 MED ORDER — BUPIVACAINE LIPOSOME 1.3 % IJ SUSP
INTRAMUSCULAR | Status: DC | PRN
  Administered 2017-12-15: 20 mL

## 2017-12-15 MED ORDER — ONDANSETRON HCL 4 MG/2ML IJ SOLN
INTRAMUSCULAR | Status: DC | PRN
  Administered 2017-12-15: 4 mg via INTRAVENOUS

## 2017-12-15 MED ORDER — PHENYLEPHRINE 40 MCG/ML (10ML) SYRINGE FOR IV PUSH (FOR BLOOD PRESSURE SUPPORT)
PREFILLED_SYRINGE | INTRAVENOUS | Status: AC
  Filled 2017-12-15: qty 10

## 2017-12-15 MED ORDER — PROPOFOL 10 MG/ML IV BOLUS
INTRAVENOUS | Status: AC
  Filled 2017-12-15: qty 40

## 2017-12-15 MED ORDER — KETOROLAC TROMETHAMINE 30 MG/ML IJ SOLN
INTRAMUSCULAR | Status: AC
  Filled 2017-12-15: qty 1

## 2017-12-15 MED ORDER — SUGAMMADEX SODIUM 200 MG/2ML IV SOLN
INTRAVENOUS | Status: AC
  Filled 2017-12-15: qty 2

## 2017-12-15 MED ORDER — MIDAZOLAM HCL 2 MG/2ML IJ SOLN: 0.5000 mg | Freq: Once | INTRAMUSCULAR | Status: DC | PRN

## 2017-12-15 MED ORDER — PROPOFOL 10 MG/ML IV BOLUS
INTRAVENOUS | Status: DC | PRN
  Administered 2017-12-15: 150 mg via INTRAVENOUS

## 2017-12-15 MED ORDER — SUCCINYLCHOLINE CHLORIDE 200 MG/10ML IV SOSY
PREFILLED_SYRINGE | INTRAVENOUS | Status: AC
  Filled 2017-12-15: qty 10

## 2017-12-15 MED ORDER — ARTIFICIAL TEARS OPHTHALMIC OINT
TOPICAL_OINTMENT | OPHTHALMIC | Status: AC
  Filled 2017-12-15: qty 3.5

## 2017-12-15 MED ORDER — HYDROMORPHONE HCL 1 MG/ML IJ SOLN
0.2500 mg | INTRAMUSCULAR | Status: DC | PRN
  Administered 2017-12-15 (×3): 0.5 mg via INTRAVENOUS
  Filled 2017-12-15 (×4): qty 0.5

## 2017-12-15 MED ORDER — SUGAMMADEX SODIUM 200 MG/2ML IV SOLN
INTRAVENOUS | Status: DC | PRN
  Administered 2017-12-15: 149.6 mg via INTRAVENOUS

## 2017-12-15 MED ORDER — CEFAZOLIN SODIUM-DEXTROSE 2-4 GM/100ML-% IV SOLN
2.0000 g | INTRAVENOUS | Status: AC
  Administered 2017-12-15: 2 g via INTRAVENOUS
  Filled 2017-12-15: qty 100

## 2017-12-15 MED ORDER — DEXAMETHASONE SODIUM PHOSPHATE 4 MG/ML IJ SOLN: 4.0000 mg | Freq: Once | INTRAMUSCULAR | Status: DC

## 2017-12-15 MED ORDER — POVIDONE-IODINE 10 % OINT PACKET
TOPICAL_OINTMENT | CUTANEOUS | Status: DC | PRN
  Administered 2017-12-15: 1 via TOPICAL

## 2017-12-15 MED ORDER — LIDOCAINE 2% (20 MG/ML) 5 ML SYRINGE
INTRAMUSCULAR | Status: DC | PRN
  Administered 2017-12-15: 40 mg via INTRAVENOUS

## 2017-12-15 MED ORDER — SUCCINYLCHOLINE CHLORIDE 20 MG/ML IJ SOLN
INTRAMUSCULAR | Status: DC | PRN
  Administered 2017-12-15: 120 mg via INTRAVENOUS

## 2017-12-15 MED ORDER — HYDROCODONE-ACETAMINOPHEN 5-325 MG PO TABS: 1.0000 | ORAL_TABLET | ORAL | 0 refills | Status: DC | PRN

## 2017-12-15 MED ORDER — KETOROLAC TROMETHAMINE 30 MG/ML IJ SOLN
30.0000 mg | Freq: Once | INTRAMUSCULAR | Status: AC
  Administered 2017-12-15: 30 mg via INTRAVENOUS
  Filled 2017-12-15: qty 1

## 2017-12-15 MED ORDER — HYDROCODONE-ACETAMINOPHEN 7.5-325 MG PO TABS: 1.0000 | ORAL_TABLET | Freq: Once | ORAL | Status: DC | PRN

## 2017-12-15 MED ORDER — ROCURONIUM BROMIDE 50 MG/5ML IV SOSY
PREFILLED_SYRINGE | INTRAVENOUS | Status: DC | PRN
  Administered 2017-12-15: 20 mg via INTRAVENOUS

## 2017-12-15 SURGICAL SUPPLY — 33 items
BLADE SURG SZ11 CARB STEEL (BLADE) ×3 IMPLANT
CHLORAPREP W/TINT 26ML (MISCELLANEOUS) ×3 IMPLANT
CLOTH BEACON ORANGE TIMEOUT ST (SAFETY) ×3 IMPLANT
COVER LIGHT HANDLE STERIS (MISCELLANEOUS) ×6 IMPLANT
ELECT REM PT RETURN 9FT ADLT (ELECTROSURGICAL) ×3
ELECTRODE REM PT RTRN 9FT ADLT (ELECTROSURGICAL) ×1 IMPLANT
GAUZE SPONGE 4X4 12PLY STRL (GAUZE/BANDAGES/DRESSINGS) ×3 IMPLANT
GLOVE BIO SURGEON STRL SZ7 (GLOVE) ×3 IMPLANT
GLOVE BIOGEL PI IND STRL 7.0 (GLOVE) ×2 IMPLANT
GLOVE BIOGEL PI INDICATOR 7.0 (GLOVE) ×4
GLOVE SURG SS PI 7.5 STRL IVOR (GLOVE) ×3 IMPLANT
GOWN STRL REUS W/ TWL LRG LVL3 (GOWN DISPOSABLE) ×1 IMPLANT
GOWN STRL REUS W/ TWL XL LVL3 (GOWN DISPOSABLE) ×1 IMPLANT
GOWN STRL REUS W/TWL LRG LVL3 (GOWN DISPOSABLE) ×2
GOWN STRL REUS W/TWL XL LVL3 (GOWN DISPOSABLE) ×2
INST SET MAJOR GENERAL (KITS) ×3 IMPLANT
KIT TURNOVER KIT A (KITS) ×3 IMPLANT
LIGASURE IMPACT 36 18CM CVD LR (INSTRUMENTS) ×3 IMPLANT
MANIFOLD NEPTUNE II (INSTRUMENTS) ×3 IMPLANT
MESH VENTRALEX ST 8CM LRG (Mesh General) ×3 IMPLANT
NEEDLE HYPO 21X1.5 SAFETY (NEEDLE) ×3 IMPLANT
NS IRRIG 1000ML POUR BTL (IV SOLUTION) ×3 IMPLANT
PACK MINOR (CUSTOM PROCEDURE TRAY) ×3 IMPLANT
PAD ARMBOARD 7.5X6 YLW CONV (MISCELLANEOUS) ×3 IMPLANT
SET BASIN LINEN APH (SET/KITS/TRAYS/PACK) ×3 IMPLANT
STAPLER VISISTAT (STAPLE) ×3 IMPLANT
SUT ETHIBOND 0 MO6 C/R (SUTURE) ×6 IMPLANT
SUT VIC AB 2-0 CT1 27 (SUTURE) ×2
SUT VIC AB 2-0 CT1 TAPERPNT 27 (SUTURE) ×1 IMPLANT
SUT VIC AB 3-0 SH 27 (SUTURE) ×2
SUT VIC AB 3-0 SH 27X BRD (SUTURE) ×1 IMPLANT
SYR 20CC LL (SYRINGE) ×3 IMPLANT
TAPE PAPER 3X10 WHT MICROPORE (GAUZE/BANDAGES/DRESSINGS) ×3 IMPLANT

## 2017-12-15 NOTE — Interval H&P Note (Signed)
History and Physical Interval Note:  12/15/2017 8:08 AM  Lisa French  has presented today for surgery, with the diagnosis of incisional hernia  The various methods of treatment have been discussed with the patient and family. After consideration of risks, benefits and other options for treatment, the patient has consented to  Procedure(s): HERNIA REPAIR INCISIONAL WITH MESH (N/A) as a surgical intervention .  The patient's history has been reviewed, patient examined, no change in status, stable for surgery.  I have reviewed the patient's chart and labs.  Questions were answered to the patient's satisfaction.     Franky MachoMark Arnecia Ector

## 2017-12-15 NOTE — Op Note (Signed)
Patient:  Rockwell GermanyDelores M Lose  DOB:  Jan 14, 1950  MRN:  [id number]   Preop Diagnosis: Incisional hernia  Postop Diagnosis: Same  Procedure: Incisional herniorrhaphy with mesh  Surgeon: Franky MachoMark Burnice Oestreicher, MD  Anes: General  Indications: Patient is a 67 year old white female who presents with an incisional hernia involving the umbilicus and the supraumbilical region.  The risks and benefits of the procedure including bleeding, infection, mesh use, the possibility of recurrence of the hernia were fully explained to the patient, who gave informed consent.  Procedure note: The patient was placed in the supine position.  After general anesthesia was administered, the abdomen was prepped and draped using the usual sterile technique with DuraPrep.  Surgical site confirmation was performed.  Incision was made through the previous surgical scar and carried from just above the umbilicus to the mid umbilical region and to the right.  Dissection was taken down to the fascia.  Multiple defects within the fascia were noted.  Some of them contained omentum.  Hernia sacs were found and excised.  Old sutures were removed to help facilitate the dissection.  Excess omentum had been present in the small hernia defects was excised using the LigaSure and reduced back into the abdomen.  The resulting defect measured approximately 3 to 4 cm in its greatest diameter.  This was longitudinal in nature.  A 6 cm Bard Ventralax ST patch was then inserted and secured to the fascia using 0 Ethibond sutures.  The overlying fascia was then closed longitudinally using 0 Ethibond interrupted sutures.  The base the umbilicus was carried back to the fascia using a 2-0 Vicryl interrupted suture.  Exparel was instilled into the surrounding wound.  The skin was closed using staples.  Betadine ointment and dry sterile dressings were applied.  All tape and needle counts were correct at the end of the procedure.  Patient was awakened and  transferred to PACU in stable condition.  Complications: None  EBL: Minimal  Specimen: None

## 2017-12-15 NOTE — Anesthesia Procedure Notes (Signed)
Procedure Name: Intubation Date/Time: 12/15/2017 8:43 AM Performed by: Andree Elk, Melvinia Ashby A, CRNA Pre-anesthesia Checklist: Patient identified, Patient being monitored, Timeout performed, Emergency Drugs available and Suction available Patient Re-evaluated:Patient Re-evaluated prior to induction Oxygen Delivery Method: Circle system utilized Preoxygenation: Pre-oxygenation with 100% oxygen Induction Type: IV induction Ventilation: Mask ventilation without difficulty Laryngoscope Size: Mac and 3 Grade View: Grade I Tube type: Oral Tube size: 7.0 mm Number of attempts: 1 Airway Equipment and Method: Stylet Placement Confirmation: ETT inserted through vocal cords under direct vision,  positive ETCO2 and breath sounds checked- equal and bilateral Secured at: 21 cm Tube secured with: Tape Dental Injury: Teeth and Oropharynx as per pre-operative assessment

## 2017-12-15 NOTE — Anesthesia Postprocedure Evaluation (Signed)
Anesthesia Post Note  Patient: Lisa French  Procedure(s) Performed: HERNIA REPAIR INCISIONAL WITH MESH (N/A Abdomen)  Patient location during evaluation: PACU Anesthesia Type: General Level of consciousness: awake and alert and oriented Pain management: pain level controlled Vital Signs Assessment: post-procedure vital signs reviewed and stable Respiratory status: spontaneous breathing Cardiovascular status: stable : Some nausea; receiving Phenergan; had Zofran and decadron in OR. Anesthetic complications: no     Last Vitals:  Vitals:   12/15/17 0727 12/15/17 0938  BP: 122/85 109/77  Pulse: 82   Resp: 16 15  Temp: 36.6 C 36.8 C  SpO2: 99%     Last Pain:  Vitals:   12/15/17 0956  TempSrc:   PainSc: 8                  ADAMS, AMY A

## 2017-12-15 NOTE — Anesthesia Preprocedure Evaluation (Signed)
Anesthesia Evaluation  Patient identified by MRN, date of birth, ID band Patient awake    Reviewed: Allergy & Precautions, NPO status , Patient's Chart, lab work & pertinent test results  History of Anesthesia Complications (+) history of anesthetic complications  Airway Mallampati: II  TM Distance: >3 FB Neck ROM: Full    Dental no notable dental hx. (+) Teeth Intact   Pulmonary neg pulmonary ROS,    Pulmonary exam normal breath sounds clear to auscultation       Cardiovascular Exercise Tolerance: Good hypertension, negative cardio ROS Normal cardiovascular examI Rhythm:Regular Rate:Normal     Neuro/Psych negative neurological ROS  negative psych ROS   GI/Hepatic GERD  Medicated and Controlled,(+) Hepatitis -, [facility] reports getting food poisoning in past  ? Hep A    Endo/Other  negative endocrine ROS  Renal/GU negative Renal ROS  negative genitourinary   Musculoskeletal  (+) Arthritis , Osteoarthritis,    Abdominal   Peds negative pediatric ROS (+)  Hematology negative hematology ROS (+) anemia ,   Anesthesia Other Findings   Reproductive/Obstetrics negative OB ROS                             Anesthesia Physical Anesthesia Plan  ASA: II  Anesthesia Plan: General   Post-op Pain Management:    Induction: Intravenous  PONV Risk Score and Plan:   Airway Management Planned: Oral ETT  Additional Equipment:   Intra-op Plan:   Post-operative Plan: Extubation in OR  Informed Consent: I have reviewed the patients History and Physical, chart, labs and discussed the procedure including the risks, benefits and alternatives for the proposed anesthesia with the patient or authorized representative who has indicated his/her understanding and acceptance.   Dental advisory given  Plan Discussed with: CRNA  Anesthesia Plan Comments:         Anesthesia Quick Evaluation

## 2017-12-15 NOTE — Discharge Instructions (Signed)
Open Hernia Repair, Adult, Care After °This sheet gives you information about how to care for yourself after your procedure. Your health care provider may also give you more specific instructions. If you have problems or questions, contact your health care provider. °What can I expect after the procedure? °After the procedure, it is common to have: °· Mild discomfort. °· Slight bruising. °· Minor swelling. °· Pain in the abdomen. ° °Follow these instructions at home: °Incision care ° °· Follow instructions from your health care provider about how to take care of your incision area. Make sure you: °? Wash your hands with soap and water before you change your bandage (dressing). If soap and water are not available, use hand sanitizer. °? Change your dressing as told by your health care provider. °? Leave stitches (sutures), skin glue, or adhesive strips in place. These skin closures may need to stay in place for 2 weeks or longer. If adhesive strip edges start to loosen and curl up, you may trim the loose edges. Do not remove adhesive strips completely unless your health care provider tells you to do that. °· Check your incision area every day for signs of infection. Check for: °? More redness, swelling, or pain. °? More fluid or blood. °? Warmth. °? Pus or a bad smell. °Activity °· Do not drive or use heavy machinery while taking prescription pain medicine. Do not drive until your health care provider approves. °· Until your health care provider approves: °? Do not lift anything that is heavier than 10 lb (4.5 kg). °? Do not play contact sports. °· Return to your normal activities as told by your health care provider. Ask your health care provider what activities are safe. °General instructions °· To prevent or treat constipation while you are taking prescription pain medicine, your health care provider may recommend that you: °? Drink enough fluid to keep your urine clear or pale yellow. °? Take over-the-counter or  prescription medicines. °? Eat foods that are high in fiber, such as fresh fruits and vegetables, whole grains, and beans. °? Limit foods that are high in fat and processed sugars, such as fried and sweet foods. °· Take over-the-counter and prescription medicines only as told by your health care provider. °· Do not take tub baths or go swimming until your health care provider approves. °· Keep all follow-up visits as told by your health care provider. This is important. °Contact a health care provider if: °· You develop a rash. °· You have more redness, swelling, or pain around your incision. °· You have more fluid or blood coming from your incision. °· Your incision feels warm to the touch. °· You have pus or a bad smell coming from your incision. °· You have a fever or chills. °· You have blood in your stool (feces). °· You have not had a bowel movement in 2-3 days. °· Your pain is not controlled with medicine. °Get help right away if: °· You have chest pain or shortness of breath. °· You feel light-headed or feel faint. °· You have severe pain. °· You vomit and your pain is worse. °This information is not intended to replace advice given to you by your health care provider. Make sure you discuss any questions you have with your health care provider. °Document Released: 07/11/2004 Document Revised: 07/12/2015 Document Reviewed: 06/05/2015 °Elsevier Interactive Patient Education © 2018 [employer] ° °

## 2017-12-15 NOTE — Transfer of Care (Signed)
Immediate Anesthesia Transfer of Care Note  Patient: Lisa French  Procedure(s) Performed: HERNIA REPAIR INCISIONAL WITH MESH (N/A Abdomen)  Patient Location: PACU  Anesthesia Type:General  Level of Consciousness: awake, oriented and patient cooperative  Airway & Oxygen Therapy: Patient Spontanous Breathing  Post-op Assessment: Report given to RN and Post -op Vital signs reviewed and stable  Post vital signs: Reviewed and stable  Last Vitals:  Vitals Value Taken Time  BP    Temp    Pulse 94 12/15/2017  9:38 AM  Resp 16 12/15/2017  9:38 AM  SpO2 97 % 12/15/2017  9:38 AM  Vitals shown include unvalidated device data.  Last Pain:  Vitals:   12/15/17 0727  TempSrc: Oral  PainSc:       Patients Stated Pain Goal: 7 (12/15/17 [id number])  Complications: No apparent anesthesia complications

## 2017-12-17 ENCOUNTER — Encounter (HOSPITAL_COMMUNITY): Payer: Self-pay | Admitting: General Surgery

## 2017-12-18 DIAGNOSIS — Z6827 Body mass index (BMI) 27.0-27.9, adult: Secondary | ICD-10-CM | POA: Diagnosis not present

## 2017-12-18 DIAGNOSIS — M79605 Pain in left leg: Secondary | ICD-10-CM | POA: Diagnosis not present

## 2017-12-23 ENCOUNTER — Encounter: Payer: Self-pay | Admitting: General Surgery

## 2017-12-23 ENCOUNTER — Ambulatory Visit (INDEPENDENT_AMBULATORY_CARE_PROVIDER_SITE_OTHER): Payer: Self-pay | Admitting: General Surgery

## 2017-12-23 VITALS — BP 118/75 | HR 93 | Temp 98.0°F | Resp 18 | Wt 163.0 lb

## 2017-12-23 DIAGNOSIS — Z09 Encounter for follow-up examination after completed treatment for conditions other than malignant neoplasm: Secondary | ICD-10-CM

## 2017-12-23 NOTE — Progress Notes (Signed)
Subjective:     Lisa French  Here for surgery follow-up.  Doing well.  Has no complaints. Objective:    BP 118/75 (BP Location: Left Arm, Patient Position: Sitting, Cuff Size: Normal)   Pulse 93   Temp 98 F (36.7 C) (Temporal)   Resp 18   Wt 163 lb (73.9 kg)   BMI 27.12 kg/m   General:  alert, cooperative and no distress  Abdomen soft, incision healing well.  Staples removed, Steri-Strips applied.     Assessment:    Doing well postoperatively.    Plan:   May resume normal activity.  Follow-up here as needed.

## 2018-01-06 ENCOUNTER — Ambulatory Visit (INDEPENDENT_AMBULATORY_CARE_PROVIDER_SITE_OTHER): Payer: Self-pay | Admitting: General Surgery

## 2018-01-06 ENCOUNTER — Encounter: Payer: Self-pay | Admitting: General Surgery

## 2018-01-06 VITALS — BP 121/77 | HR 97 | Temp 97.3°F | Resp 16 | Wt 163.8 lb

## 2018-01-06 DIAGNOSIS — Z09 Encounter for follow-up examination after completed treatment for conditions other than malignant neoplasm: Secondary | ICD-10-CM

## 2018-01-06 NOTE — Progress Notes (Signed)
Subjective:     Lisa French  Here for wound check.  Patient states the midportion of the wound has been draining yellow fluid.  No purulent drainage, fever, chills are noted. Objective:    BP 121/77 (BP Location: Left Arm, Patient Position: Sitting, Cuff Size: Normal)   Pulse 97   Temp (!) 97.3 F (36.3 C) (Temporal)   Resp 16   Wt 163 lb 12.8 oz (74.3 kg)   BMI 27.26 kg/m   General:  alert, cooperative and no distress  Abdomen soft.  Incision with small superficial dehiscence along the midportion which extends approximately three quarters of a centimeters in length.  Silver nitrate was applied.  Steri-Strips were reapplied.  No erythema, induration, purulent drainage present.     Assessment:    Superficial incision separation.  No evidence of infection.    Plan:   Keep wound dry as able.  Follow-up here as needed should the wound not closed.

## 2018-02-28 DIAGNOSIS — I10 Essential (primary) hypertension: Secondary | ICD-10-CM | POA: Diagnosis not present

## 2018-02-28 DIAGNOSIS — N183 Chronic kidney disease, stage 3 (moderate): Secondary | ICD-10-CM | POA: Diagnosis not present

## 2018-02-28 DIAGNOSIS — K210 Gastro-esophageal reflux disease with esophagitis: Secondary | ICD-10-CM | POA: Diagnosis not present

## 2018-03-01 DIAGNOSIS — Z6826 Body mass index (BMI) 26.0-26.9, adult: Secondary | ICD-10-CM | POA: Diagnosis not present

## 2018-03-01 DIAGNOSIS — K589 Irritable bowel syndrome without diarrhea: Secondary | ICD-10-CM | POA: Diagnosis not present

## 2018-03-01 DIAGNOSIS — N183 Chronic kidney disease, stage 3 (moderate): Secondary | ICD-10-CM | POA: Diagnosis not present

## 2018-03-01 DIAGNOSIS — J329 Chronic sinusitis, unspecified: Secondary | ICD-10-CM | POA: Diagnosis not present

## 2018-03-01 DIAGNOSIS — K219 Gastro-esophageal reflux disease without esophagitis: Secondary | ICD-10-CM | POA: Diagnosis not present

## 2018-03-01 DIAGNOSIS — I10 Essential (primary) hypertension: Secondary | ICD-10-CM | POA: Diagnosis not present

## 2018-03-01 DIAGNOSIS — G43009 Migraine without aura, not intractable, without status migrainosus: Secondary | ICD-10-CM | POA: Diagnosis not present

## 2018-03-03 DIAGNOSIS — K589 Irritable bowel syndrome without diarrhea: Secondary | ICD-10-CM | POA: Diagnosis not present

## 2018-03-03 DIAGNOSIS — M549 Dorsalgia, unspecified: Secondary | ICD-10-CM | POA: Diagnosis not present

## 2018-03-03 DIAGNOSIS — N898 Other specified noninflammatory disorders of vagina: Secondary | ICD-10-CM | POA: Diagnosis not present

## 2018-03-03 DIAGNOSIS — Z9851 Tubal ligation status: Secondary | ICD-10-CM | POA: Diagnosis not present

## 2018-03-03 DIAGNOSIS — I10 Essential (primary) hypertension: Secondary | ICD-10-CM | POA: Diagnosis not present

## 2018-03-03 DIAGNOSIS — R109 Unspecified abdominal pain: Secondary | ICD-10-CM | POA: Diagnosis not present

## 2018-03-03 DIAGNOSIS — K219 Gastro-esophageal reflux disease without esophagitis: Secondary | ICD-10-CM | POA: Diagnosis not present

## 2018-03-03 DIAGNOSIS — Z79899 Other long term (current) drug therapy: Secondary | ICD-10-CM | POA: Diagnosis not present

## 2018-03-03 DIAGNOSIS — R05 Cough: Secondary | ICD-10-CM | POA: Diagnosis not present

## 2018-03-03 DIAGNOSIS — Z9049 Acquired absence of other specified parts of digestive tract: Secondary | ICD-10-CM | POA: Diagnosis not present

## 2018-03-04 DIAGNOSIS — R109 Unspecified abdominal pain: Secondary | ICD-10-CM | POA: Diagnosis not present

## 2018-04-21 DIAGNOSIS — S134XXA Sprain of ligaments of cervical spine, initial encounter: Secondary | ICD-10-CM | POA: Diagnosis not present

## 2018-04-21 DIAGNOSIS — S233XXA Sprain of ligaments of thoracic spine, initial encounter: Secondary | ICD-10-CM | POA: Diagnosis not present

## 2018-04-21 DIAGNOSIS — M9901 Segmental and somatic dysfunction of cervical region: Secondary | ICD-10-CM | POA: Diagnosis not present

## 2018-04-25 DIAGNOSIS — S134XXA Sprain of ligaments of cervical spine, initial encounter: Secondary | ICD-10-CM | POA: Diagnosis not present

## 2018-04-25 DIAGNOSIS — S233XXA Sprain of ligaments of thoracic spine, initial encounter: Secondary | ICD-10-CM | POA: Diagnosis not present

## 2018-04-25 DIAGNOSIS — M9901 Segmental and somatic dysfunction of cervical region: Secondary | ICD-10-CM | POA: Diagnosis not present

## 2018-05-04 ENCOUNTER — Inpatient Hospital Stay: Admit: 2018-05-04 | Payer: Medicare Other | Admitting: Orthopedic Surgery

## 2018-05-04 SURGERY — TOTAL HIP REVISION
Anesthesia: Choice | Laterality: Right

## 2018-05-11 DIAGNOSIS — M9901 Segmental and somatic dysfunction of cervical region: Secondary | ICD-10-CM | POA: Diagnosis not present

## 2018-05-11 DIAGNOSIS — S134XXA Sprain of ligaments of cervical spine, initial encounter: Secondary | ICD-10-CM | POA: Diagnosis not present

## 2018-05-11 DIAGNOSIS — S233XXA Sprain of ligaments of thoracic spine, initial encounter: Secondary | ICD-10-CM | POA: Diagnosis not present

## 2018-05-13 DIAGNOSIS — S134XXA Sprain of ligaments of cervical spine, initial encounter: Secondary | ICD-10-CM | POA: Diagnosis not present

## 2018-05-13 DIAGNOSIS — M9901 Segmental and somatic dysfunction of cervical region: Secondary | ICD-10-CM | POA: Diagnosis not present

## 2018-05-13 DIAGNOSIS — S233XXA Sprain of ligaments of thoracic spine, initial encounter: Secondary | ICD-10-CM | POA: Diagnosis not present

## 2018-05-31 DIAGNOSIS — R05 Cough: Secondary | ICD-10-CM | POA: Diagnosis not present

## 2018-05-31 DIAGNOSIS — I10 Essential (primary) hypertension: Secondary | ICD-10-CM | POA: Diagnosis not present

## 2018-05-31 DIAGNOSIS — Z6826 Body mass index (BMI) 26.0-26.9, adult: Secondary | ICD-10-CM | POA: Diagnosis not present

## 2018-06-12 ENCOUNTER — Emergency Department (HOSPITAL_COMMUNITY): Payer: Medicare Other

## 2018-06-12 ENCOUNTER — Emergency Department (HOSPITAL_COMMUNITY)
Admission: EM | Admit: 2018-06-12 | Discharge: 2018-06-12 | Disposition: A | Discharge status: Home / Self Care | Payer: Medicare Other | Attending: Emergency Medicine | Admitting: Emergency Medicine

## 2018-06-12 ENCOUNTER — Other Ambulatory Visit: Payer: Self-pay

## 2018-06-12 ENCOUNTER — Encounter (HOSPITAL_COMMUNITY): Payer: Self-pay | Admitting: *Deleted

## 2018-06-12 DIAGNOSIS — Z96641 Presence of right artificial hip joint: Secondary | ICD-10-CM | POA: Diagnosis not present

## 2018-06-12 DIAGNOSIS — Z79899 Other long term (current) drug therapy: Secondary | ICD-10-CM | POA: Insufficient documentation

## 2018-06-12 DIAGNOSIS — I10 Essential (primary) hypertension: Secondary | ICD-10-CM | POA: Insufficient documentation

## 2018-06-12 DIAGNOSIS — H538 Other visual disturbances: Secondary | ICD-10-CM

## 2018-06-12 MED ORDER — TETRACAINE HCL 0.5 % OP SOLN
2.0000 [drp] | Freq: Once | OPHTHALMIC | Status: AC
  Administered 2018-06-12: 2 [drp] via OPHTHALMIC
  Filled 2018-06-12: qty 4

## 2018-06-12 MED ORDER — FLUORESCEIN SODIUM 1 MG OP STRP
1.0000 | ORAL_STRIP | Freq: Once | OPHTHALMIC | Status: AC
  Administered 2018-06-12: 1 via OPHTHALMIC
  Filled 2018-06-12: qty 1

## 2018-06-12 NOTE — ED Triage Notes (Signed)
Pt c/o sudden onset of blurry vision to left eye that started 30 minutes ago, pt states that the blurriness is better after arrival to er,

## 2018-06-12 NOTE — Discharge Instructions (Addendum)
Your vital signs, CT scan, and eye examination are all within normal limits.  Your visual acuity is within normal limits while wearing your glasses. Your vision seems to have improved significantly after being in the emergency department.  Please see your ophthalmology specialist tomorrow for recheck and evaluation concerning the blurred vision.  Please inform your ophthalmologist that your CT scan was negative tonight.  Please return to the emergency department immediately if there are any changes in your vision, difficulty with your speech, changes in your balance, changes in your use of upper or lower extremities, changes in your condition, problems or concerns.

## 2018-06-12 NOTE — ED Notes (Signed)
Visual accuity  OS 20/15  OD 20/15  OU 20/13  With glasses

## 2018-06-12 NOTE — ED Provider Notes (Signed)
[facility] EMERGENCY DEPARTMENT Provider Note   CSN: [id number] Arrival date & time: 06/12/18  2059    History   Chief Complaint Chief Complaint  Patient presents with  . Blurred Vision    HPI Lisa French is a 68 y.o. female.     Patient is a 68 year old female who presents to the emergency department with a complaint of blurred vision.  The patient states that she has been working outside today and working with some chemicals.  She got some MRI, but washed it out immediately.  She continued working outside.  She came inside and prepared dinner.  She was sitting after dinner and noted a sudden blurring of vision in her left eye.  She could look into the next room and make out figures and objects, but she could not tell the details of the face, and she could not read any print of any kind from any distance.  She notified her primary physician and was told to come to the emergency department.  The patient denies any unusual headache, changes in speech or balance.  No changes in strength of the upper or lower extremity.  No changes in medications or diet.  Patient wears glasses.  The history is provided by the patient.    Past Medical History:  Diagnosis Date  . Arthritis   . Complication of anesthesia    difficulty breathing after surgery  . GERD (gastroesophageal reflux disease)   . Hepatitis   . Hypertension     Patient Active Problem List   Diagnosis Date Noted  . Incisional hernia, without obstruction or gangrene   . Postop Acute blood loss anemia 10/29/2011  . OA (osteoarthritis) of hip 10/28/2011  . HYPERTENSION 08/09/2008  . DYSPEPSIA 08/09/2008  . ARTHRITIS 08/09/2008    Past Surgical History:  Procedure Laterality Date  . CHOLECYSTECTOMY    . COLONOSCOPY   03/27/2002   WUJ:[id number]:Internal hemorrhoids; otherwise normal rectum./Normal colon and terminal ileum  . INCISIONAL HERNIA REPAIR N/A 12/15/2017   Procedure: HERNIA REPAIR INCISIONAL WITH MESH;  Surgeon:  Franky MachoJenkins, Mark, MD;  Location: [facility];  Service: General;  Laterality: N/A;  . TOTAL HIP ARTHROPLASTY  10/28/2011   Procedure: TOTAL HIP ARTHROPLASTY;  Surgeon: Loanne DrillingFrank V Aluisio, MD;  Location: [facility];  Service: Orthopedics;  Laterality: Right;  . TUBAL LIGATION       OB History   No obstetric history on file.      Home Medications    Prior to Admission medications   Medication Sig Start Date End Date Taking? Authorizing Provider  lisinopril (PRINIVIL,ZESTRIL) 10 MG tablet Take 10 mg by mouth every morning.    [provider]  pantoprazole (PROTONIX) 40 MG tablet Take 40 mg by mouth daily.     [provider]  simvastatin (ZOCOR) 10 MG tablet Take 10 mg by mouth daily.    [provider]    Family History Family History  Problem Relation Age of Onset  . Lung cancer Father   . Breast cancer Sister   . Colon cancer Sister   . Crohn's disease Paternal Aunt     Social History Social History   Tobacco Use  . Smoking status: Never Smoker  . Smokeless tobacco: Never Used  Substance Use Topics  . Alcohol use: No  . Drug use: No     Allergies   Patient has no known allergies.   Review of Systems Review of Systems  Constitutional: Negative for activity change.  All ROS Neg except as noted in HPI  HENT: Negative.   Eyes: Negative for photophobia, pain, discharge and redness.       Blurred vision  Respiratory: Negative for cough, shortness of breath and wheezing.   Cardiovascular: Negative for chest pain and palpitations.  Gastrointestinal: Negative for abdominal pain and blood in stool.  Genitourinary: Negative for dysuria, frequency and hematuria.  Musculoskeletal: Negative for arthralgias, back pain and neck pain.  Skin: Negative.   Neurological: Negative for dizziness, seizures and speech difficulty.  Psychiatric/Behavioral: Negative for confusion and hallucinations.     Physical Exam Updated Vital Signs BP 126/85 (BP Location:  Right Arm)   Pulse 92   Temp 98 F (36.7 C) (Oral)   Resp 16   Ht 5\' 5"  (1.651 m)   Wt 73.9 kg   SpO2 99%   BMI 27.12 kg/m   Physical Exam Vitals signs and nursing note reviewed.  Constitutional:      Appearance: She is well-developed. She is not toxic-appearing.  HENT:     Head: Normocephalic.     Right Ear: Tympanic membrane and external ear normal.     Left Ear: Tympanic membrane and external ear normal.  Eyes:     General: Lids are normal. No visual field deficit.       Right eye: No foreign body, discharge or hordeolum.        Left eye: No foreign body, discharge or hordeolum.     Extraocular Movements: Extraocular movements intact.     Conjunctiva/sclera:     Left eye: Left conjunctiva is injected.     Pupils: Pupils are equal, round, and reactive to light.     Left eye: No corneal abrasion or fluorescein uptake.     Funduscopic exam:    Right eye: No hemorrhage, exudate, AV nicking or papilledema.        Left eye: No hemorrhage, exudate, AV nicking or papilledema.     Slit lamp exam:    Left eye: No corneal ulcer, foreign body, hyphema, hypopyon, anterior chamber bulge or photophobia.  Neck:     Musculoskeletal: Normal range of motion and neck supple.     Vascular: No carotid bruit.     Comments: No carotid bruits Cardiovascular:     Rate and Rhythm: Normal rate and regular rhythm.     Pulses: Normal pulses.     Heart sounds: Normal heart sounds.  Pulmonary:     Effort: No respiratory distress.     Breath sounds: Normal breath sounds.  Abdominal:     General: Bowel sounds are normal.     Palpations: Abdomen is soft.     Tenderness: There is no abdominal tenderness. There is no guarding.  Musculoskeletal: Normal range of motion.  Lymphadenopathy:     Head:     Right side of head: No submandibular adenopathy.     Left side of head: No submandibular adenopathy.     Cervical: No cervical adenopathy.  Skin:    General: Skin is warm and dry.  Neurological:      General: No focal deficit present.     Mental Status: She is alert and oriented to person, place, and time.     Cranial Nerves: No cranial nerve deficit.     Sensory: No sensory deficit.     Motor: No weakness.     Coordination: Coordination normal.     Gait: Gait normal.  Psychiatric:        Mood and Affect:  Mood normal.        Speech: Speech normal.        Behavior: Behavior normal.        Thought Content: Thought content normal.      ED Treatments / Results  Labs (all labs ordered are listed, but only abnormal results are displayed) Labs Reviewed - No data to display  EKG None  Radiology No results found.  Procedures Procedures (including critical care time)  Medications Ordered in ED Medications - No data to display   Initial Impression / Assessment and Plan / ED Course  I have reviewed the triage vital signs and the nursing notes.  Pertinent labs & imaging results that were available during my care of the patient were reviewed by me and considered in my medical decision making (see chart for details).          Final Clinical Impressions(s) / ED Diagnoses MDM Vital signs reviewed.  Patient reports that approximately 30 minutes prior to her arrival in the emergency department she had a sudden onset of blurry vision.  She says that everything looked blurry and rough.  She could look into the next room and see an object, but she could not make out any fine print or she could not make out the facial details of her husband.  She notified her physician and came to the emergency department.   9:54pm CT head scan is negative for acute intracranial abnormality. No complaint of unusual headache.  The extraocular movements are intact without blurred vision or double vision.  The visual fields are intact.  The visual acuity is reviewed.  OS 20/15, OD 20/15, OU 20/13 with glasses.  Funduscopic and slit light examination negative for papilledema, hemorrhage, changes in  the anterior chamber, corneal ulcer, hypopyon or other changes.  Pt seen with me by Dr Roderic Palau.  I have asked the patient to see her ophthalmologist on tomorrow, or as soon as possible.  Have asked her to return to the emergency department immediately if any return of the blurred vision, changes in her vision, headache, changes in her balance, or other neurologic issues.  Patient is in agreement with this plan.   Final diagnoses:  Blurred vision, left eye    ED Discharge Orders    None       Lily Kocher, PA-C 06/12/18 2245    Milton Ferguson, MD 06/13/18 [phone]

## 2018-06-13 DIAGNOSIS — H2513 Age-related nuclear cataract, bilateral: Secondary | ICD-10-CM | POA: Diagnosis not present

## 2018-06-13 DIAGNOSIS — H35363 Drusen (degenerative) of macula, bilateral: Secondary | ICD-10-CM | POA: Diagnosis not present

## 2018-06-13 DIAGNOSIS — B394 Histoplasmosis capsulati, unspecified: Secondary | ICD-10-CM | POA: Diagnosis not present

## 2018-06-13 DIAGNOSIS — H25013 Cortical age-related cataract, bilateral: Secondary | ICD-10-CM | POA: Diagnosis not present

## 2018-07-14 DIAGNOSIS — H353122 Nonexudative age-related macular degeneration, left eye, intermediate dry stage: Secondary | ICD-10-CM | POA: Diagnosis not present

## 2018-07-27 DIAGNOSIS — K210 Gastro-esophageal reflux disease with esophagitis: Secondary | ICD-10-CM | POA: Diagnosis not present

## 2018-07-27 DIAGNOSIS — N183 Chronic kidney disease, stage 3 (moderate): Secondary | ICD-10-CM | POA: Diagnosis not present

## 2018-07-27 DIAGNOSIS — I10 Essential (primary) hypertension: Secondary | ICD-10-CM | POA: Diagnosis not present

## 2018-08-03 DIAGNOSIS — G43009 Migraine without aura, not intractable, without status migrainosus: Secondary | ICD-10-CM | POA: Diagnosis not present

## 2018-08-03 DIAGNOSIS — N183 Chronic kidney disease, stage 3 (moderate): Secondary | ICD-10-CM | POA: Diagnosis not present

## 2018-08-03 DIAGNOSIS — Z6827 Body mass index (BMI) 27.0-27.9, adult: Secondary | ICD-10-CM | POA: Diagnosis not present

## 2018-08-03 DIAGNOSIS — K219 Gastro-esophageal reflux disease without esophagitis: Secondary | ICD-10-CM | POA: Diagnosis not present

## 2018-08-03 DIAGNOSIS — I10 Essential (primary) hypertension: Secondary | ICD-10-CM | POA: Diagnosis not present

## 2018-08-03 DIAGNOSIS — E782 Mixed hyperlipidemia: Secondary | ICD-10-CM | POA: Diagnosis not present

## 2018-08-03 DIAGNOSIS — Z0001 Encounter for general adult medical examination with abnormal findings: Secondary | ICD-10-CM | POA: Diagnosis not present

## 2018-08-03 DIAGNOSIS — K589 Irritable bowel syndrome without diarrhea: Secondary | ICD-10-CM | POA: Diagnosis not present

## 2018-08-05 DIAGNOSIS — M9901 Segmental and somatic dysfunction of cervical region: Secondary | ICD-10-CM | POA: Diagnosis not present

## 2018-08-05 DIAGNOSIS — S233XXA Sprain of ligaments of thoracic spine, initial encounter: Secondary | ICD-10-CM | POA: Diagnosis not present

## 2018-08-05 DIAGNOSIS — S134XXA Sprain of ligaments of cervical spine, initial encounter: Secondary | ICD-10-CM | POA: Diagnosis not present

## 2018-08-08 DIAGNOSIS — S233XXA Sprain of ligaments of thoracic spine, initial encounter: Secondary | ICD-10-CM | POA: Diagnosis not present

## 2018-08-08 DIAGNOSIS — S134XXA Sprain of ligaments of cervical spine, initial encounter: Secondary | ICD-10-CM | POA: Diagnosis not present

## 2018-08-08 DIAGNOSIS — M9901 Segmental and somatic dysfunction of cervical region: Secondary | ICD-10-CM | POA: Diagnosis not present

## 2018-08-13 DIAGNOSIS — H353122 Nonexudative age-related macular degeneration, left eye, intermediate dry stage: Secondary | ICD-10-CM | POA: Diagnosis not present

## 2018-09-13 DIAGNOSIS — J0101 Acute recurrent maxillary sinusitis: Secondary | ICD-10-CM | POA: Diagnosis not present

## 2018-09-13 DIAGNOSIS — I10 Essential (primary) hypertension: Secondary | ICD-10-CM | POA: Diagnosis not present

## 2018-10-04 DIAGNOSIS — M8588 Other specified disorders of bone density and structure, other site: Secondary | ICD-10-CM | POA: Diagnosis not present

## 2018-10-04 DIAGNOSIS — M810 Age-related osteoporosis without current pathological fracture: Secondary | ICD-10-CM | POA: Diagnosis not present

## 2018-10-09 DIAGNOSIS — N3001 Acute cystitis with hematuria: Secondary | ICD-10-CM | POA: Diagnosis not present

## 2018-10-09 DIAGNOSIS — R8290 Unspecified abnormal findings in urine: Secondary | ICD-10-CM | POA: Diagnosis not present

## 2018-10-09 DIAGNOSIS — R300 Dysuria: Secondary | ICD-10-CM | POA: Diagnosis not present

## 2018-10-18 DIAGNOSIS — Z23 Encounter for immunization: Secondary | ICD-10-CM | POA: Diagnosis not present

## 2018-10-19 DIAGNOSIS — N6312 Unspecified lump in the right breast, upper inner quadrant: Secondary | ICD-10-CM | POA: Diagnosis not present

## 2018-10-26 ENCOUNTER — Other Ambulatory Visit: Payer: Self-pay

## 2018-10-26 DIAGNOSIS — Z20828 Contact with and (suspected) exposure to other viral communicable diseases: Secondary | ICD-10-CM | POA: Diagnosis not present

## 2018-10-26 DIAGNOSIS — Z20822 Contact with and (suspected) exposure to covid-19: Secondary | ICD-10-CM

## 2018-10-28 LAB — NOVEL CORONAVIRUS, NAA: SARS-CoV-2, NAA: NOT DETECTED

## 2018-11-25 ENCOUNTER — Other Ambulatory Visit: Payer: Self-pay

## 2018-11-25 DIAGNOSIS — Z20828 Contact with and (suspected) exposure to other viral communicable diseases: Secondary | ICD-10-CM | POA: Diagnosis not present

## 2018-11-25 DIAGNOSIS — Z20822 Contact with and (suspected) exposure to covid-19: Secondary | ICD-10-CM

## 2018-11-27 LAB — NOVEL CORONAVIRUS, NAA: SARS-CoV-2, NAA: NOT DETECTED

## 2019-01-27 DIAGNOSIS — Z20828 Contact with and (suspected) exposure to other viral communicable diseases: Secondary | ICD-10-CM | POA: Diagnosis not present

## 2019-02-07 ENCOUNTER — Ambulatory Visit: Payer: Medicare Other | Attending: Internal Medicine

## 2019-02-07 ENCOUNTER — Other Ambulatory Visit: Payer: Self-pay

## 2019-02-07 DIAGNOSIS — Z20822 Contact with and (suspected) exposure to covid-19: Secondary | ICD-10-CM | POA: Diagnosis not present

## 2019-02-08 LAB — NOVEL CORONAVIRUS, NAA: SARS-CoV-2, NAA: NOT DETECTED

## 2019-09-12 DIAGNOSIS — H15109 Unspecified episcleritis, unspecified eye: Secondary | ICD-10-CM | POA: Diagnosis not present

## 2020-01-15 DIAGNOSIS — I10 Essential (primary) hypertension: Secondary | ICD-10-CM | POA: Diagnosis not present

## 2020-01-15 DIAGNOSIS — E7849 Other hyperlipidemia: Secondary | ICD-10-CM | POA: Diagnosis not present

## 2020-01-15 DIAGNOSIS — Z1159 Encounter for screening for other viral diseases: Secondary | ICD-10-CM | POA: Diagnosis not present

## 2020-01-15 DIAGNOSIS — N183 Chronic kidney disease, stage 3 unspecified: Secondary | ICD-10-CM | POA: Diagnosis not present

## 2020-01-15 DIAGNOSIS — K219 Gastro-esophageal reflux disease without esophagitis: Secondary | ICD-10-CM | POA: Diagnosis not present

## 2020-01-15 DIAGNOSIS — Z1329 Encounter for screening for other suspected endocrine disorder: Secondary | ICD-10-CM | POA: Diagnosis not present

## 2020-01-25 DIAGNOSIS — Z1231 Encounter for screening mammogram for malignant neoplasm of breast: Secondary | ICD-10-CM | POA: Diagnosis not present

## 2020-02-07 DIAGNOSIS — Z6826 Body mass index (BMI) 26.0-26.9, adult: Secondary | ICD-10-CM | POA: Diagnosis not present

## 2020-02-07 DIAGNOSIS — G43009 Migraine without aura, not intractable, without status migrainosus: Secondary | ICD-10-CM | POA: Diagnosis not present

## 2020-02-07 DIAGNOSIS — I10 Essential (primary) hypertension: Secondary | ICD-10-CM | POA: Diagnosis not present

## 2020-02-07 DIAGNOSIS — K2100 Gastro-esophageal reflux disease with esophagitis, without bleeding: Secondary | ICD-10-CM | POA: Diagnosis not present

## 2020-02-07 DIAGNOSIS — E7849 Other hyperlipidemia: Secondary | ICD-10-CM | POA: Diagnosis not present

## 2020-02-07 DIAGNOSIS — K589 Irritable bowel syndrome without diarrhea: Secondary | ICD-10-CM | POA: Diagnosis not present

## 2020-02-07 DIAGNOSIS — N1832 Chronic kidney disease, stage 3b: Secondary | ICD-10-CM | POA: Diagnosis not present

## 2020-02-27 DIAGNOSIS — H25013 Cortical age-related cataract, bilateral: Secondary | ICD-10-CM | POA: Diagnosis not present

## 2020-02-27 DIAGNOSIS — H2513 Age-related nuclear cataract, bilateral: Secondary | ICD-10-CM | POA: Diagnosis not present

## 2020-02-27 DIAGNOSIS — H04123 Dry eye syndrome of bilateral lacrimal glands: Secondary | ICD-10-CM | POA: Diagnosis not present

## 2020-02-27 DIAGNOSIS — H35363 Drusen (degenerative) of macula, bilateral: Secondary | ICD-10-CM | POA: Diagnosis not present

## 2020-04-01 DIAGNOSIS — K219 Gastro-esophageal reflux disease without esophagitis: Secondary | ICD-10-CM | POA: Diagnosis not present

## 2020-04-01 DIAGNOSIS — I129 Hypertensive chronic kidney disease with stage 1 through stage 4 chronic kidney disease, or unspecified chronic kidney disease: Secondary | ICD-10-CM | POA: Diagnosis not present

## 2020-04-01 DIAGNOSIS — N1832 Chronic kidney disease, stage 3b: Secondary | ICD-10-CM | POA: Diagnosis not present

## 2020-04-01 DIAGNOSIS — Z87440 Personal history of urinary (tract) infections: Secondary | ICD-10-CM | POA: Diagnosis not present

## 2020-04-01 DIAGNOSIS — R8281 Pyuria: Secondary | ICD-10-CM | POA: Diagnosis not present

## 2020-04-05 DIAGNOSIS — S61214A Laceration without foreign body of right ring finger without damage to nail, initial encounter: Secondary | ICD-10-CM | POA: Diagnosis not present

## 2020-04-05 DIAGNOSIS — S61011A Laceration without foreign body of right thumb without damage to nail, initial encounter: Secondary | ICD-10-CM | POA: Diagnosis not present

## 2020-04-09 DIAGNOSIS — N2889 Other specified disorders of kidney and ureter: Secondary | ICD-10-CM | POA: Diagnosis not present

## 2020-04-09 DIAGNOSIS — I129 Hypertensive chronic kidney disease with stage 1 through stage 4 chronic kidney disease, or unspecified chronic kidney disease: Secondary | ICD-10-CM | POA: Diagnosis not present

## 2020-04-23 DIAGNOSIS — H2513 Age-related nuclear cataract, bilateral: Secondary | ICD-10-CM | POA: Diagnosis not present

## 2020-04-23 DIAGNOSIS — H35363 Drusen (degenerative) of macula, bilateral: Secondary | ICD-10-CM | POA: Diagnosis not present

## 2020-04-23 DIAGNOSIS — H04123 Dry eye syndrome of bilateral lacrimal glands: Secondary | ICD-10-CM | POA: Diagnosis not present

## 2020-04-23 DIAGNOSIS — H25013 Cortical age-related cataract, bilateral: Secondary | ICD-10-CM | POA: Diagnosis not present

## 2020-04-29 DIAGNOSIS — M9902 Segmental and somatic dysfunction of thoracic region: Secondary | ICD-10-CM | POA: Diagnosis not present

## 2020-04-29 DIAGNOSIS — S335XXA Sprain of ligaments of lumbar spine, initial encounter: Secondary | ICD-10-CM | POA: Diagnosis not present

## 2020-04-29 DIAGNOSIS — S233XXA Sprain of ligaments of thoracic spine, initial encounter: Secondary | ICD-10-CM | POA: Diagnosis not present

## 2020-04-29 DIAGNOSIS — M9904 Segmental and somatic dysfunction of sacral region: Secondary | ICD-10-CM | POA: Diagnosis not present

## 2020-04-29 DIAGNOSIS — S134XXA Sprain of ligaments of cervical spine, initial encounter: Secondary | ICD-10-CM | POA: Diagnosis not present

## 2020-04-29 DIAGNOSIS — M531 Cervicobrachial syndrome: Secondary | ICD-10-CM | POA: Diagnosis not present

## 2020-04-29 DIAGNOSIS — M9901 Segmental and somatic dysfunction of cervical region: Secondary | ICD-10-CM | POA: Diagnosis not present

## 2020-05-03 DIAGNOSIS — S233XXA Sprain of ligaments of thoracic spine, initial encounter: Secondary | ICD-10-CM | POA: Diagnosis not present

## 2020-05-03 DIAGNOSIS — M9904 Segmental and somatic dysfunction of sacral region: Secondary | ICD-10-CM | POA: Diagnosis not present

## 2020-05-03 DIAGNOSIS — S335XXA Sprain of ligaments of lumbar spine, initial encounter: Secondary | ICD-10-CM | POA: Diagnosis not present

## 2020-05-03 DIAGNOSIS — S134XXA Sprain of ligaments of cervical spine, initial encounter: Secondary | ICD-10-CM | POA: Diagnosis not present

## 2020-05-03 DIAGNOSIS — M9901 Segmental and somatic dysfunction of cervical region: Secondary | ICD-10-CM | POA: Diagnosis not present

## 2020-05-03 DIAGNOSIS — M9902 Segmental and somatic dysfunction of thoracic region: Secondary | ICD-10-CM | POA: Diagnosis not present

## 2020-05-03 DIAGNOSIS — M531 Cervicobrachial syndrome: Secondary | ICD-10-CM | POA: Diagnosis not present

## 2020-06-10 DIAGNOSIS — J329 Chronic sinusitis, unspecified: Secondary | ICD-10-CM | POA: Diagnosis not present

## 2020-06-15 DIAGNOSIS — J019 Acute sinusitis, unspecified: Secondary | ICD-10-CM | POA: Diagnosis not present

## 2020-06-15 DIAGNOSIS — J209 Acute bronchitis, unspecified: Secondary | ICD-10-CM | POA: Diagnosis not present

## 2020-06-19 DIAGNOSIS — J019 Acute sinusitis, unspecified: Secondary | ICD-10-CM | POA: Diagnosis not present

## 2020-06-19 DIAGNOSIS — J209 Acute bronchitis, unspecified: Secondary | ICD-10-CM | POA: Diagnosis not present

## 2020-07-12 DIAGNOSIS — H2513 Age-related nuclear cataract, bilateral: Secondary | ICD-10-CM | POA: Diagnosis not present

## 2020-07-12 DIAGNOSIS — H35363 Drusen (degenerative) of macula, bilateral: Secondary | ICD-10-CM | POA: Diagnosis not present

## 2020-07-12 DIAGNOSIS — H25013 Cortical age-related cataract, bilateral: Secondary | ICD-10-CM | POA: Diagnosis not present

## 2020-07-12 DIAGNOSIS — H2512 Age-related nuclear cataract, left eye: Secondary | ICD-10-CM | POA: Diagnosis not present

## 2020-07-12 DIAGNOSIS — H04123 Dry eye syndrome of bilateral lacrimal glands: Secondary | ICD-10-CM | POA: Diagnosis not present

## 2020-08-14 DIAGNOSIS — M79605 Pain in left leg: Secondary | ICD-10-CM | POA: Diagnosis not present

## 2020-08-14 DIAGNOSIS — M62838 Other muscle spasm: Secondary | ICD-10-CM | POA: Diagnosis not present

## 2020-08-14 DIAGNOSIS — Z6826 Body mass index (BMI) 26.0-26.9, adult: Secondary | ICD-10-CM | POA: Diagnosis not present

## 2020-08-21 DIAGNOSIS — H2512 Age-related nuclear cataract, left eye: Secondary | ICD-10-CM | POA: Diagnosis not present

## 2020-08-21 DIAGNOSIS — H25812 Combined forms of age-related cataract, left eye: Secondary | ICD-10-CM | POA: Diagnosis not present

## 2020-08-27 DIAGNOSIS — H25011 Cortical age-related cataract, right eye: Secondary | ICD-10-CM | POA: Diagnosis not present

## 2020-08-27 DIAGNOSIS — H2511 Age-related nuclear cataract, right eye: Secondary | ICD-10-CM | POA: Diagnosis not present

## 2020-09-11 DIAGNOSIS — H25811 Combined forms of age-related cataract, right eye: Secondary | ICD-10-CM | POA: Diagnosis not present

## 2020-09-11 DIAGNOSIS — H25011 Cortical age-related cataract, right eye: Secondary | ICD-10-CM | POA: Diagnosis not present

## 2020-09-11 DIAGNOSIS — H2511 Age-related nuclear cataract, right eye: Secondary | ICD-10-CM | POA: Diagnosis not present

## 2020-09-26 DIAGNOSIS — N183 Chronic kidney disease, stage 3 unspecified: Secondary | ICD-10-CM | POA: Diagnosis not present

## 2020-10-02 DIAGNOSIS — N1832 Chronic kidney disease, stage 3b: Secondary | ICD-10-CM | POA: Diagnosis not present

## 2020-10-02 DIAGNOSIS — N183 Chronic kidney disease, stage 3 unspecified: Secondary | ICD-10-CM | POA: Diagnosis not present

## 2020-10-02 DIAGNOSIS — I129 Hypertensive chronic kidney disease with stage 1 through stage 4 chronic kidney disease, or unspecified chronic kidney disease: Secondary | ICD-10-CM | POA: Diagnosis not present

## 2020-10-02 DIAGNOSIS — N390 Urinary tract infection, site not specified: Secondary | ICD-10-CM | POA: Diagnosis not present

## 2020-10-30 DIAGNOSIS — H04123 Dry eye syndrome of bilateral lacrimal glands: Secondary | ICD-10-CM | POA: Diagnosis not present

## 2021-01-20 IMAGING — CT CT HEAD WITHOUT CONTRAST
3 series · 15 of 47 positions shown, 18 images · non-contrast
Comparison: [HOSPITAL] [facility]int[facility] MRI cervical spine 08/02/2007.

CLINICAL DATA: 67-year-old female with sudden onset blurred vision
in the left eye times 30 minutes.

EXAM:
CT HEAD WITHOUT CONTRAST
TECHNIQUE: Contiguous axial images were obtained from the base of the skull
through the vertex without intravenous contrast.

[Series 2: head w o · axial · 0.42mm/px · z∈[+25,+160]mm · 9 of 33 slices shown, 12 images]
[im 3/33  brain]
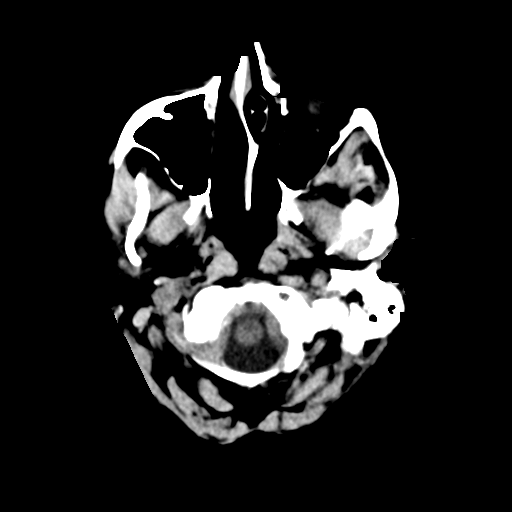
[im 3/33  bone]
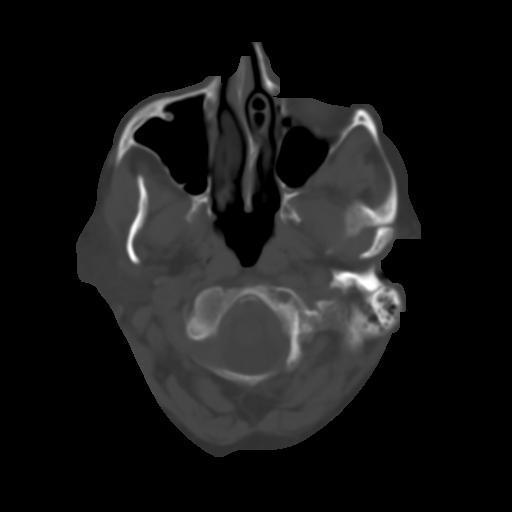
[im 6/33  brain]
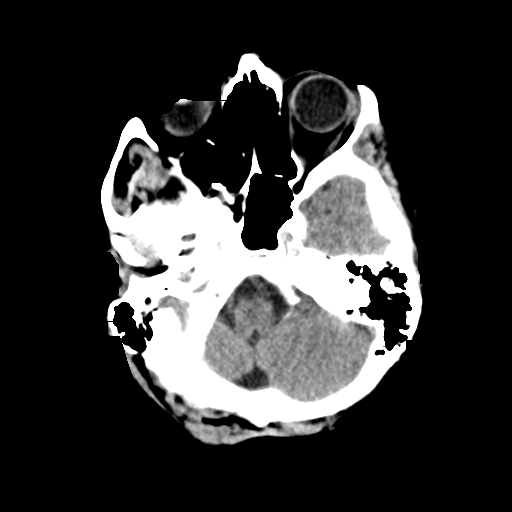
[im 9/33  brain]
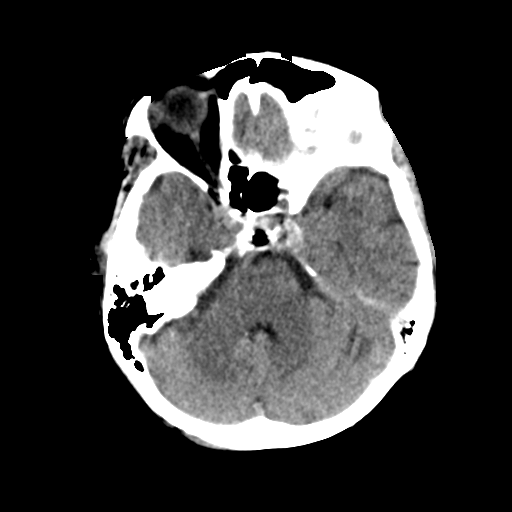
[im 13/33  brain]
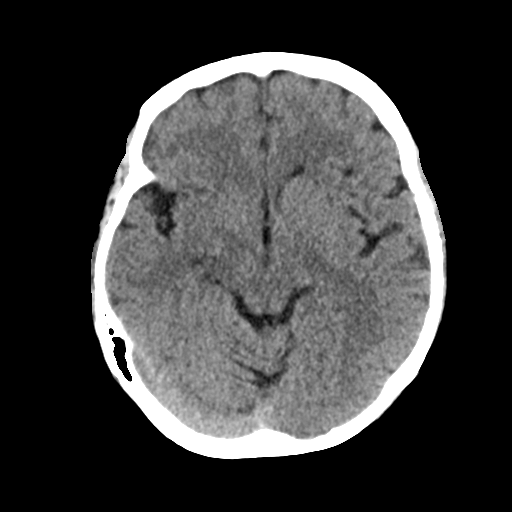
[im 17/33  brain]
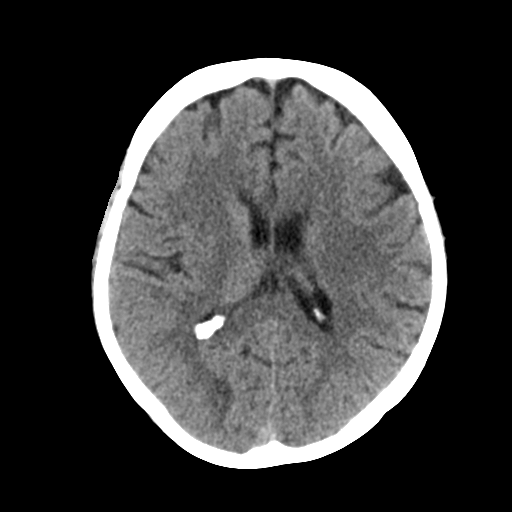
[im 17/33  bone]
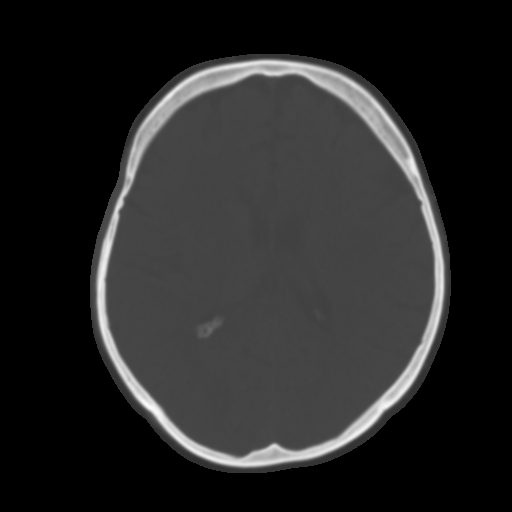
[im 20/33  brain]
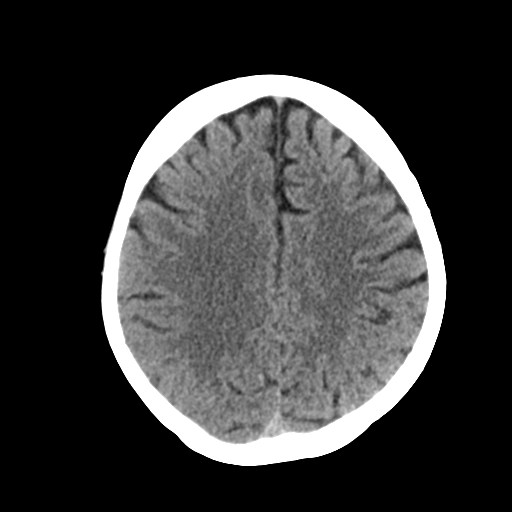
[im 24/33  brain]
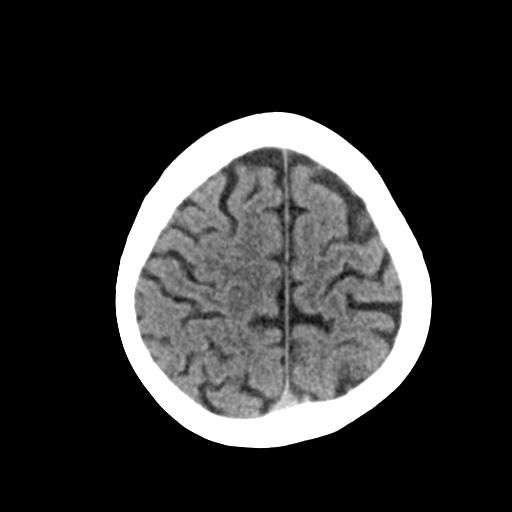
[im 27/33  brain]
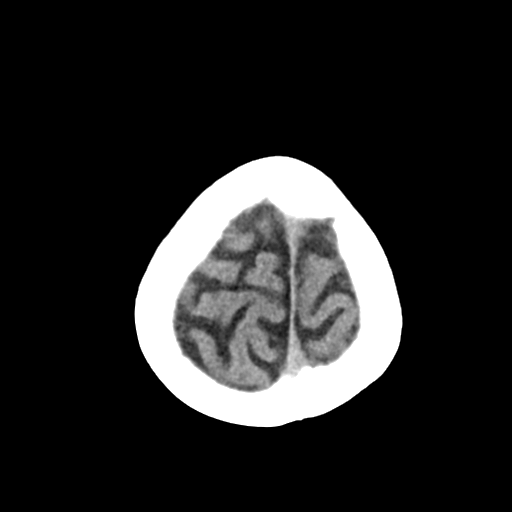
[im 30/33  brain]
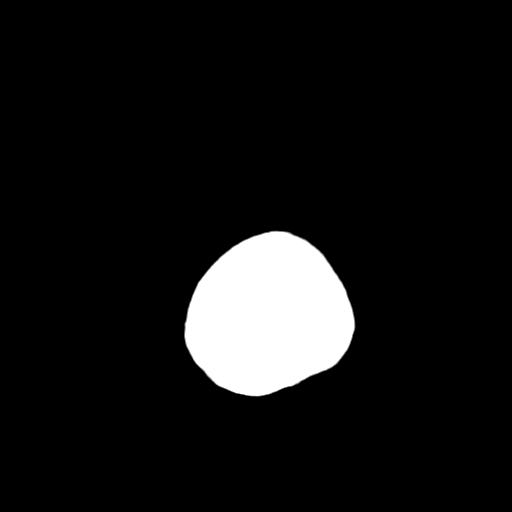
[im 30/33  bone]
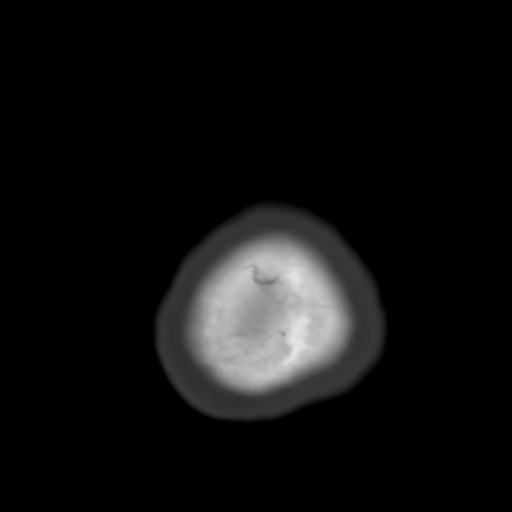

[Series 4: coronal soft · coronal · 0.32mm/px · 3 of 65 slices shown]
[im 22/65  brain]
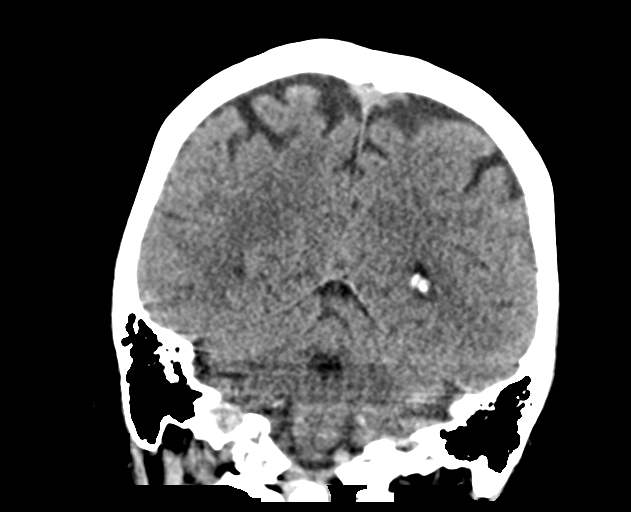
[im 29/65  brain]
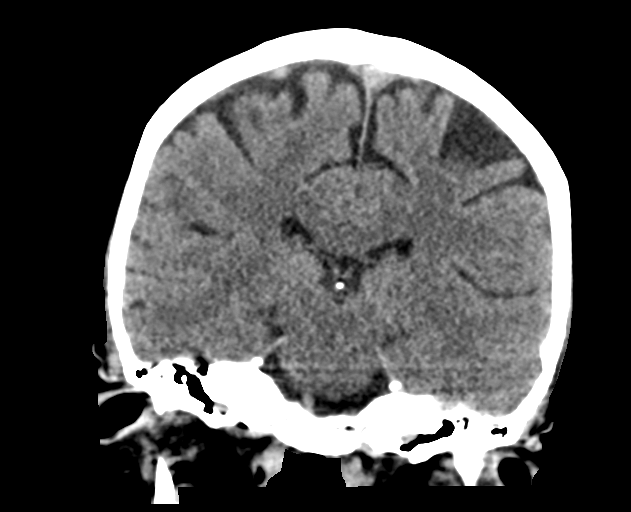
[im 36/65  brain]
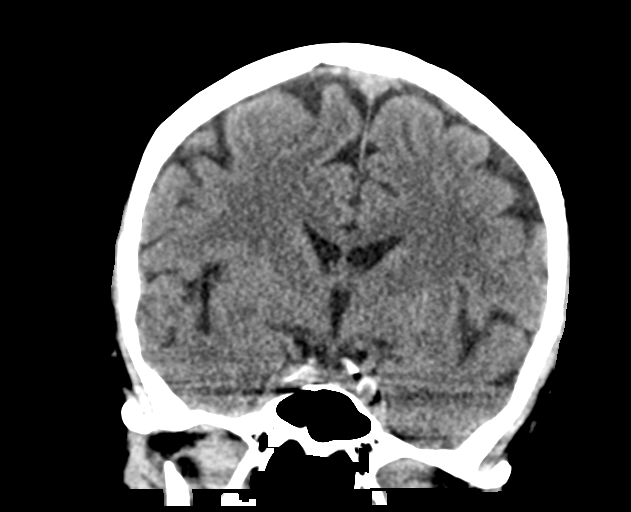

[Series 5: sagittal soft · sagittal · 0.33mm/px · 3 of 58 slices shown]
[im 20/58  brain]
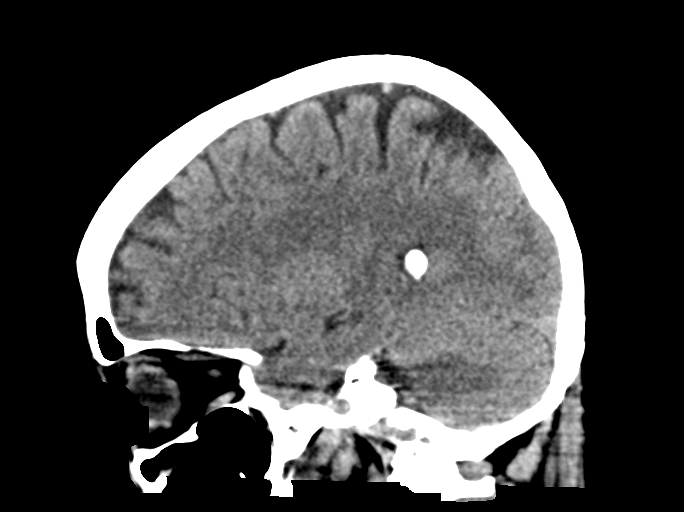
[im 29/58  brain]
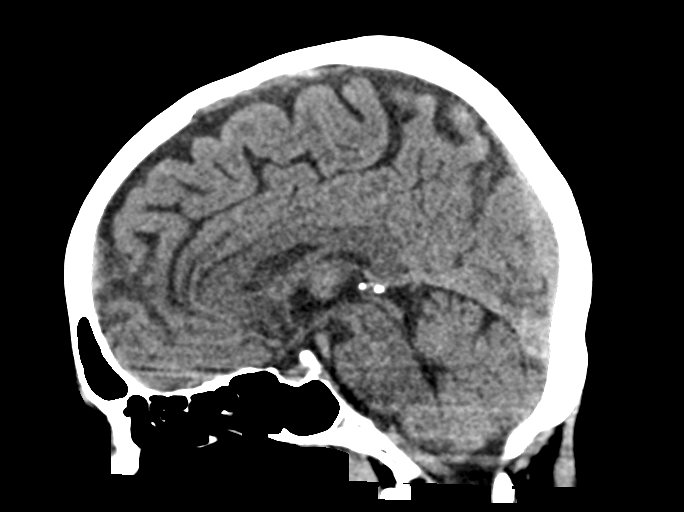
[im 39/58  brain]
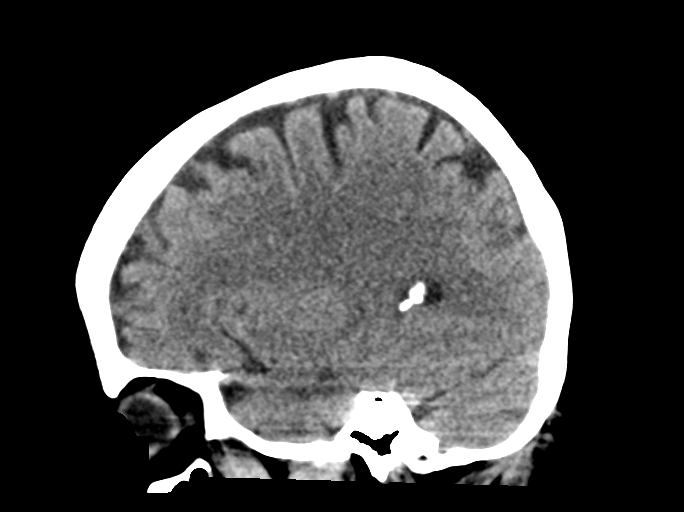

[15 of 47 positions shown; findings below may reference images not displayed]

FINDINGS: Brain: Cerebral volume is within normal limits for age. No midline
shift, ventriculomegaly, mass effect, evidence of mass lesion,
intracranial hemorrhage or evidence of cortically based acute
infarction. Gray-white matter differentiation is within normal
limits throughout the brain. Normal suprasellar cistern.

Vascular: Mild Calcified atherosclerosis at the skull base. No
suspicious intracranial vascular hyperdensity.

Skull: Negative.

Sinuses/Orbits: Visualized paranasal sinuses and mastoids are clear.

Other: Bilateral orbits soft tissues appear symmetric and normal.
Visualized scalp soft tissues are within normal limits.
IMPRESSION: Normal noncontrast head CT.

## 2021-04-01 DIAGNOSIS — I10 Essential (primary) hypertension: Secondary | ICD-10-CM | POA: Diagnosis not present

## 2021-04-01 DIAGNOSIS — R252 Cramp and spasm: Secondary | ICD-10-CM | POA: Diagnosis not present

## 2021-04-01 DIAGNOSIS — L299 Pruritus, unspecified: Secondary | ICD-10-CM | POA: Diagnosis not present

## 2021-04-29 DIAGNOSIS — J209 Acute bronchitis, unspecified: Secondary | ICD-10-CM | POA: Diagnosis not present

## 2021-04-29 DIAGNOSIS — H109 Unspecified conjunctivitis: Secondary | ICD-10-CM | POA: Diagnosis not present

## 2021-04-29 DIAGNOSIS — I10 Essential (primary) hypertension: Secondary | ICD-10-CM | POA: Diagnosis not present

## 2021-04-29 DIAGNOSIS — Z6826 Body mass index (BMI) 26.0-26.9, adult: Secondary | ICD-10-CM | POA: Diagnosis not present

## 2021-06-12 DIAGNOSIS — H524 Presbyopia: Secondary | ICD-10-CM | POA: Diagnosis not present

## 2021-06-12 DIAGNOSIS — B394 Histoplasmosis capsulati, unspecified: Secondary | ICD-10-CM | POA: Diagnosis not present

## 2021-06-12 DIAGNOSIS — H04123 Dry eye syndrome of bilateral lacrimal glands: Secondary | ICD-10-CM | POA: Diagnosis not present

## 2021-06-12 DIAGNOSIS — H35363 Drusen (degenerative) of macula, bilateral: Secondary | ICD-10-CM | POA: Diagnosis not present

## 2021-08-15 DIAGNOSIS — M9901 Segmental and somatic dysfunction of cervical region: Secondary | ICD-10-CM | POA: Diagnosis not present

## 2021-08-15 DIAGNOSIS — S134XXA Sprain of ligaments of cervical spine, initial encounter: Secondary | ICD-10-CM | POA: Diagnosis not present

## 2021-08-15 DIAGNOSIS — M531 Cervicobrachial syndrome: Secondary | ICD-10-CM | POA: Diagnosis not present

## 2021-08-15 DIAGNOSIS — S335XXA Sprain of ligaments of lumbar spine, initial encounter: Secondary | ICD-10-CM | POA: Diagnosis not present

## 2021-08-15 DIAGNOSIS — M9902 Segmental and somatic dysfunction of thoracic region: Secondary | ICD-10-CM | POA: Diagnosis not present

## 2021-08-15 DIAGNOSIS — M9904 Segmental and somatic dysfunction of sacral region: Secondary | ICD-10-CM | POA: Diagnosis not present

## 2021-08-15 DIAGNOSIS — S233XXA Sprain of ligaments of thoracic spine, initial encounter: Secondary | ICD-10-CM | POA: Diagnosis not present

## 2021-08-19 DIAGNOSIS — M9904 Segmental and somatic dysfunction of sacral region: Secondary | ICD-10-CM | POA: Diagnosis not present

## 2021-08-19 DIAGNOSIS — S233XXA Sprain of ligaments of thoracic spine, initial encounter: Secondary | ICD-10-CM | POA: Diagnosis not present

## 2021-08-19 DIAGNOSIS — S335XXA Sprain of ligaments of lumbar spine, initial encounter: Secondary | ICD-10-CM | POA: Diagnosis not present

## 2021-08-19 DIAGNOSIS — M531 Cervicobrachial syndrome: Secondary | ICD-10-CM | POA: Diagnosis not present

## 2021-08-19 DIAGNOSIS — S134XXA Sprain of ligaments of cervical spine, initial encounter: Secondary | ICD-10-CM | POA: Diagnosis not present

## 2021-08-19 DIAGNOSIS — M9901 Segmental and somatic dysfunction of cervical region: Secondary | ICD-10-CM | POA: Diagnosis not present

## 2021-08-19 DIAGNOSIS — M9902 Segmental and somatic dysfunction of thoracic region: Secondary | ICD-10-CM | POA: Diagnosis not present

## 2021-08-22 DIAGNOSIS — S134XXA Sprain of ligaments of cervical spine, initial encounter: Secondary | ICD-10-CM | POA: Diagnosis not present

## 2021-08-22 DIAGNOSIS — S335XXA Sprain of ligaments of lumbar spine, initial encounter: Secondary | ICD-10-CM | POA: Diagnosis not present

## 2021-08-22 DIAGNOSIS — M9901 Segmental and somatic dysfunction of cervical region: Secondary | ICD-10-CM | POA: Diagnosis not present

## 2021-08-22 DIAGNOSIS — M9904 Segmental and somatic dysfunction of sacral region: Secondary | ICD-10-CM | POA: Diagnosis not present

## 2021-08-22 DIAGNOSIS — M531 Cervicobrachial syndrome: Secondary | ICD-10-CM | POA: Diagnosis not present

## 2021-08-22 DIAGNOSIS — M9902 Segmental and somatic dysfunction of thoracic region: Secondary | ICD-10-CM | POA: Diagnosis not present

## 2021-08-22 DIAGNOSIS — S233XXA Sprain of ligaments of thoracic spine, initial encounter: Secondary | ICD-10-CM | POA: Diagnosis not present

## 2021-08-26 DIAGNOSIS — M9902 Segmental and somatic dysfunction of thoracic region: Secondary | ICD-10-CM | POA: Diagnosis not present

## 2021-08-26 DIAGNOSIS — S335XXA Sprain of ligaments of lumbar spine, initial encounter: Secondary | ICD-10-CM | POA: Diagnosis not present

## 2021-08-26 DIAGNOSIS — S233XXA Sprain of ligaments of thoracic spine, initial encounter: Secondary | ICD-10-CM | POA: Diagnosis not present

## 2021-08-26 DIAGNOSIS — S134XXA Sprain of ligaments of cervical spine, initial encounter: Secondary | ICD-10-CM | POA: Diagnosis not present

## 2021-08-26 DIAGNOSIS — M9904 Segmental and somatic dysfunction of sacral region: Secondary | ICD-10-CM | POA: Diagnosis not present

## 2021-08-26 DIAGNOSIS — M9901 Segmental and somatic dysfunction of cervical region: Secondary | ICD-10-CM | POA: Diagnosis not present

## 2021-08-26 DIAGNOSIS — M531 Cervicobrachial syndrome: Secondary | ICD-10-CM | POA: Diagnosis not present

## 2021-09-19 DIAGNOSIS — Z6827 Body mass index (BMI) 27.0-27.9, adult: Secondary | ICD-10-CM | POA: Diagnosis not present

## 2021-09-19 DIAGNOSIS — R030 Elevated blood-pressure reading, without diagnosis of hypertension: Secondary | ICD-10-CM | POA: Diagnosis not present

## 2021-09-19 DIAGNOSIS — R059 Cough, unspecified: Secondary | ICD-10-CM | POA: Diagnosis not present

## 2021-09-19 DIAGNOSIS — J0101 Acute recurrent maxillary sinusitis: Secondary | ICD-10-CM | POA: Diagnosis not present

## 2021-11-06 DIAGNOSIS — I83893 Varicose veins of bilateral lower extremities with other complications: Secondary | ICD-10-CM | POA: Diagnosis not present

## 2021-11-14 DIAGNOSIS — L299 Pruritus, unspecified: Secondary | ICD-10-CM | POA: Diagnosis not present

## 2021-11-14 DIAGNOSIS — R252 Cramp and spasm: Secondary | ICD-10-CM | POA: Diagnosis not present

## 2021-11-14 DIAGNOSIS — I10 Essential (primary) hypertension: Secondary | ICD-10-CM | POA: Diagnosis not present

## 2021-11-14 DIAGNOSIS — I83893 Varicose veins of bilateral lower extremities with other complications: Secondary | ICD-10-CM | POA: Diagnosis not present

## 2021-12-05 DIAGNOSIS — N1832 Chronic kidney disease, stage 3b: Secondary | ICD-10-CM | POA: Diagnosis not present

## 2021-12-08 DIAGNOSIS — I83811 Varicose veins of right lower extremities with pain: Secondary | ICD-10-CM | POA: Diagnosis not present

## 2021-12-08 DIAGNOSIS — M7989 Other specified soft tissue disorders: Secondary | ICD-10-CM | POA: Diagnosis not present

## 2021-12-08 DIAGNOSIS — I83891 Varicose veins of right lower extremities with other complications: Secondary | ICD-10-CM | POA: Diagnosis not present

## 2021-12-10 DIAGNOSIS — I87392 Chronic venous hypertension (idiopathic) with other complications of left lower extremity: Secondary | ICD-10-CM | POA: Diagnosis not present

## 2021-12-10 DIAGNOSIS — I83892 Varicose veins of left lower extremities with other complications: Secondary | ICD-10-CM | POA: Diagnosis not present

## 2021-12-18 DIAGNOSIS — N183 Chronic kidney disease, stage 3 unspecified: Secondary | ICD-10-CM | POA: Diagnosis not present

## 2021-12-18 DIAGNOSIS — N1832 Chronic kidney disease, stage 3b: Secondary | ICD-10-CM | POA: Diagnosis not present

## 2021-12-18 DIAGNOSIS — E559 Vitamin D deficiency, unspecified: Secondary | ICD-10-CM | POA: Diagnosis not present

## 2021-12-18 DIAGNOSIS — I129 Hypertensive chronic kidney disease with stage 1 through stage 4 chronic kidney disease, or unspecified chronic kidney disease: Secondary | ICD-10-CM | POA: Diagnosis not present

## 2021-12-25 DIAGNOSIS — N183 Chronic kidney disease, stage 3 unspecified: Secondary | ICD-10-CM | POA: Diagnosis not present

## 2021-12-25 DIAGNOSIS — I10 Essential (primary) hypertension: Secondary | ICD-10-CM | POA: Diagnosis not present

## 2021-12-25 DIAGNOSIS — E039 Hypothyroidism, unspecified: Secondary | ICD-10-CM | POA: Diagnosis not present

## 2021-12-25 DIAGNOSIS — K2100 Gastro-esophageal reflux disease with esophagitis, without bleeding: Secondary | ICD-10-CM | POA: Diagnosis not present

## 2021-12-25 DIAGNOSIS — Z0001 Encounter for general adult medical examination with abnormal findings: Secondary | ICD-10-CM | POA: Diagnosis not present

## 2021-12-25 DIAGNOSIS — E7849 Other hyperlipidemia: Secondary | ICD-10-CM | POA: Diagnosis not present

## 2021-12-25 DIAGNOSIS — Z1231 Encounter for screening mammogram for malignant neoplasm of breast: Secondary | ICD-10-CM | POA: Diagnosis not present

## 2022-01-01 DIAGNOSIS — E7849 Other hyperlipidemia: Secondary | ICD-10-CM | POA: Diagnosis not present

## 2022-01-01 DIAGNOSIS — N1832 Chronic kidney disease, stage 3b: Secondary | ICD-10-CM | POA: Diagnosis not present

## 2022-01-01 DIAGNOSIS — G43009 Migraine without aura, not intractable, without status migrainosus: Secondary | ICD-10-CM | POA: Diagnosis not present

## 2022-01-01 DIAGNOSIS — Z6825 Body mass index (BMI) 25.0-25.9, adult: Secondary | ICD-10-CM | POA: Diagnosis not present

## 2022-01-01 DIAGNOSIS — Z0001 Encounter for general adult medical examination with abnormal findings: Secondary | ICD-10-CM | POA: Diagnosis not present

## 2022-01-01 DIAGNOSIS — K2100 Gastro-esophageal reflux disease with esophagitis, without bleeding: Secondary | ICD-10-CM | POA: Diagnosis not present

## 2022-01-01 DIAGNOSIS — K589 Irritable bowel syndrome without diarrhea: Secondary | ICD-10-CM | POA: Diagnosis not present

## 2022-01-01 DIAGNOSIS — I10 Essential (primary) hypertension: Secondary | ICD-10-CM | POA: Diagnosis not present

## 2022-01-01 DIAGNOSIS — Z23 Encounter for immunization: Secondary | ICD-10-CM | POA: Diagnosis not present

## 2022-01-22 DIAGNOSIS — Z20828 Contact with and (suspected) exposure to other viral communicable diseases: Secondary | ICD-10-CM | POA: Diagnosis not present

## 2022-01-22 DIAGNOSIS — J209 Acute bronchitis, unspecified: Secondary | ICD-10-CM | POA: Diagnosis not present

## 2022-01-22 DIAGNOSIS — R059 Cough, unspecified: Secondary | ICD-10-CM | POA: Diagnosis not present

## 2022-01-22 DIAGNOSIS — R030 Elevated blood-pressure reading, without diagnosis of hypertension: Secondary | ICD-10-CM | POA: Diagnosis not present

## 2022-01-22 DIAGNOSIS — Z6825 Body mass index (BMI) 25.0-25.9, adult: Secondary | ICD-10-CM | POA: Diagnosis not present

## 2022-05-26 DIAGNOSIS — Z961 Presence of intraocular lens: Secondary | ICD-10-CM | POA: Diagnosis not present

## 2022-05-26 DIAGNOSIS — H353131 Nonexudative age-related macular degeneration, bilateral, early dry stage: Secondary | ICD-10-CM | POA: Diagnosis not present

## 2022-05-26 DIAGNOSIS — H524 Presbyopia: Secondary | ICD-10-CM | POA: Diagnosis not present

## 2022-06-23 DIAGNOSIS — K2100 Gastro-esophageal reflux disease with esophagitis, without bleeding: Secondary | ICD-10-CM | POA: Diagnosis not present

## 2022-06-23 DIAGNOSIS — E039 Hypothyroidism, unspecified: Secondary | ICD-10-CM | POA: Diagnosis not present

## 2022-06-23 DIAGNOSIS — N1832 Chronic kidney disease, stage 3b: Secondary | ICD-10-CM | POA: Diagnosis not present

## 2022-06-30 DIAGNOSIS — N1832 Chronic kidney disease, stage 3b: Secondary | ICD-10-CM | POA: Diagnosis not present

## 2022-06-30 DIAGNOSIS — Z6825 Body mass index (BMI) 25.0-25.9, adult: Secondary | ICD-10-CM | POA: Diagnosis not present

## 2022-06-30 DIAGNOSIS — G43009 Migraine without aura, not intractable, without status migrainosus: Secondary | ICD-10-CM | POA: Diagnosis not present

## 2022-06-30 DIAGNOSIS — M79672 Pain in left foot: Secondary | ICD-10-CM | POA: Diagnosis not present

## 2022-06-30 DIAGNOSIS — K2100 Gastro-esophageal reflux disease with esophagitis, without bleeding: Secondary | ICD-10-CM | POA: Diagnosis not present

## 2022-06-30 DIAGNOSIS — K589 Irritable bowel syndrome without diarrhea: Secondary | ICD-10-CM | POA: Diagnosis not present

## 2022-06-30 DIAGNOSIS — I10 Essential (primary) hypertension: Secondary | ICD-10-CM | POA: Diagnosis not present

## 2022-06-30 DIAGNOSIS — E7849 Other hyperlipidemia: Secondary | ICD-10-CM | POA: Diagnosis not present

## 2022-08-05 DIAGNOSIS — M2042 Other hammer toe(s) (acquired), left foot: Secondary | ICD-10-CM | POA: Diagnosis not present

## 2022-08-05 DIAGNOSIS — M79672 Pain in left foot: Secondary | ICD-10-CM | POA: Diagnosis not present

## 2022-08-05 DIAGNOSIS — M7742 Metatarsalgia, left foot: Secondary | ICD-10-CM | POA: Diagnosis not present

## 2022-08-05 DIAGNOSIS — M25375 Other instability, left foot: Secondary | ICD-10-CM | POA: Diagnosis not present

## 2022-08-06 ENCOUNTER — Other Ambulatory Visit (HOSPITAL_COMMUNITY): Payer: Self-pay | Admitting: Orthopedic Surgery

## 2022-09-17 ENCOUNTER — Encounter (HOSPITAL_BASED_OUTPATIENT_CLINIC_OR_DEPARTMENT_OTHER): Payer: Self-pay | Admitting: Orthopedic Surgery

## 2022-09-17 ENCOUNTER — Other Ambulatory Visit: Payer: Self-pay

## 2022-09-22 ENCOUNTER — Encounter (HOSPITAL_BASED_OUTPATIENT_CLINIC_OR_DEPARTMENT_OTHER)
Admission: RE | Admit: 2022-09-22 | Discharge: 2022-09-22 | Disposition: A | Discharge status: Home / Self Care | Payer: Medicare Other | Source: Ambulatory Visit | Attending: Orthopedic Surgery | Admitting: Orthopedic Surgery

## 2022-09-22 DIAGNOSIS — I10 Essential (primary) hypertension: Secondary | ICD-10-CM | POA: Diagnosis not present

## 2022-09-22 DIAGNOSIS — Z01812 Encounter for preprocedural laboratory examination: Secondary | ICD-10-CM | POA: Diagnosis not present

## 2022-09-22 NOTE — Progress Notes (Signed)

## 2022-09-23 NOTE — H&P (Signed)
Lisa French is an 72 y.o. female.   Chief Complaint: left foot pain HPI: Patient is a 72 year old female that presents to the OR today for definitive treatment of her left forefoot pain and second and third hammertoe deformities.  She has failed conservative treatment with activity modification, oral anti-inflammatories, and footwear modification.  Allergies: No Known Allergies  Past Medical History:  Diagnosis Date   Arthritis    Complication of anesthesia    difficulty breathing after surgery   GERD (gastroesophageal reflux disease)    Hepatitis    Hypertension     Past Surgical History:  Procedure Laterality Date   CHOLECYSTECTOMY     COLONOSCOPY   03/27/2002   KGM:[id number] hemorrhoids; otherwise normal rectum./Normal colon and terminal ileum   INCISIONAL HERNIA REPAIR N/A 12/15/2017   Procedure: HERNIA REPAIR INCISIONAL WITH MESH;  Surgeon: Franky Macho, MD;  Location: [facility];  Service: General;  Laterality: N/A;   TOTAL HIP ARTHROPLASTY  10/28/2011   Procedure: TOTAL HIP ARTHROPLASTY;  Surgeon: Loanne Drilling, MD;  Location: [facility];  Service: Orthopedics;  Laterality: Right;   TUBAL LIGATION      Family History: Family History  Problem Relation Age of Onset   Lung cancer Father    Breast cancer Sister    Colon cancer Sister    Crohn's disease Paternal Aunt     Social History:   reports that she has never smoked. She has never used smokeless tobacco. She reports that she does not drink alcohol and does not use drugs.  Medications: No medications prior to admission.    No results found for this or any previous visit (from the past 48 hour(s)).  No results found.    Height 5\' 5"  (1.651 m), weight 69.4 kg.  PE:  well nourished and well developed.  NAD.  EOMI.  Resp unlabored.  Left 2nd/3rd hammertoes.  Tender to palpation at L 2nd/3rd MTP joints  Assessment/Plan Left 2nd/3rd hammertoes and metatarsalgia  Patient presents to the OR today for elective  left second and third metatarsal Weil osteotomies, hammertoe corrections, and possible collateral ligament repairs.  After reviewing the procedure, postoperative protocol, and risk of surgery the patient elects for surgical intervention.  The patient specifically understands risks of bleeding, infection, nerve damage, blood clots, need for additional surgery, continued pain, nonunion, post traumatic arthritis, recurrence of deformity, amputation and death.   Alfredo Martinez PA-C [facility] Office:  [phone]

## 2022-09-24 ENCOUNTER — Encounter (HOSPITAL_BASED_OUTPATIENT_CLINIC_OR_DEPARTMENT_OTHER): Payer: Self-pay | Admitting: Orthopedic Surgery

## 2022-09-24 ENCOUNTER — Encounter (HOSPITAL_BASED_OUTPATIENT_CLINIC_OR_DEPARTMENT_OTHER)
Admission: RE | Disposition: A | Discharge status: Home / Self Care | Payer: Self-pay | Source: Home / Self Care | Attending: Orthopedic Surgery

## 2022-09-24 ENCOUNTER — Other Ambulatory Visit: Payer: Self-pay

## 2022-09-24 ENCOUNTER — Ambulatory Visit (HOSPITAL_BASED_OUTPATIENT_CLINIC_OR_DEPARTMENT_OTHER): Payer: Medicare Other | Admitting: Anesthesiology

## 2022-09-24 ENCOUNTER — Ambulatory Visit (HOSPITAL_BASED_OUTPATIENT_CLINIC_OR_DEPARTMENT_OTHER): Payer: Medicare Other

## 2022-09-24 ENCOUNTER — Ambulatory Visit (HOSPITAL_BASED_OUTPATIENT_CLINIC_OR_DEPARTMENT_OTHER)
Admission: RE | Admit: 2022-09-24 | Discharge: 2022-09-24 | Disposition: A | Discharge status: Home / Self Care | Payer: Medicare Other | Attending: Orthopedic Surgery | Admitting: Orthopedic Surgery

## 2022-09-24 DIAGNOSIS — I10 Essential (primary) hypertension: Secondary | ICD-10-CM | POA: Diagnosis not present

## 2022-09-24 DIAGNOSIS — M2042 Other hammer toe(s) (acquired), left foot: Secondary | ICD-10-CM | POA: Diagnosis not present

## 2022-09-24 DIAGNOSIS — M1990 Unspecified osteoarthritis, unspecified site: Secondary | ICD-10-CM | POA: Insufficient documentation

## 2022-09-24 DIAGNOSIS — M25375 Other instability, left foot: Secondary | ICD-10-CM | POA: Diagnosis not present

## 2022-09-24 DIAGNOSIS — M7742 Metatarsalgia, left foot: Secondary | ICD-10-CM | POA: Diagnosis not present

## 2022-09-24 DIAGNOSIS — G8918 Other acute postprocedural pain: Secondary | ICD-10-CM | POA: Diagnosis not present

## 2022-09-24 HISTORY — PX: HAMMERTOE RECONSTRUCTION WITH WEIL OSTEOTOMY: SHX5631

## 2022-09-24 SURGERY — HAMMERTOE RECONSTRUCTION WITH WEIL OSTEOTOMY
Anesthesia: Regional | Site: Toe | Laterality: Left

## 2022-09-24 MED ORDER — SENNA 8.6 MG PO TABS
2.0000 | ORAL_TABLET | Freq: Two times a day (BID) | ORAL | 0 refills | Status: AC
Start: 2022-09-24 — End: ?

## 2022-09-24 MED ORDER — DOCUSATE SODIUM 100 MG PO CAPS
100.0000 mg | ORAL_CAPSULE | Freq: Two times a day (BID) | ORAL | 0 refills | Status: AC
Start: 2022-09-24 — End: ?

## 2022-09-24 MED ORDER — AMISULPRIDE (ANTIEMETIC) 5 MG/2ML IV SOLN: 10.0000 mg | Freq: Once | INTRAVENOUS | Status: DC | PRN

## 2022-09-24 MED ORDER — FENTANYL CITRATE (PF) 100 MCG/2ML IJ SOLN
INTRAMUSCULAR | Status: AC
  Filled 2022-09-24: qty 2

## 2022-09-24 MED ORDER — VANCOMYCIN HCL 500 MG IV SOLR
INTRAVENOUS | Status: AC
  Filled 2022-09-24: qty 60

## 2022-09-24 MED ORDER — ONDANSETRON HCL 4 MG/2ML IJ SOLN: 4.0000 mg | Freq: Once | INTRAMUSCULAR | Status: DC | PRN

## 2022-09-24 MED ORDER — MIDAZOLAM HCL 2 MG/2ML IJ SOLN
2.0000 mg | Freq: Once | INTRAMUSCULAR | Status: AC
  Administered 2022-09-24: 2 mg via INTRAVENOUS

## 2022-09-24 MED ORDER — SODIUM CHLORIDE 0.9 % IV SOLN: INTRAVENOUS | Status: DC

## 2022-09-24 MED ORDER — MIDAZOLAM HCL 2 MG/2ML IJ SOLN
INTRAMUSCULAR | Status: AC
  Filled 2022-09-24: qty 2

## 2022-09-24 MED ORDER — VASOPRESSIN 20 UNIT/ML IV SOLN
INTRAVENOUS | Status: DC | PRN
Start: 2022-09-24 — End: 2022-09-24
  Administered 2022-09-24 (×2): 1 [IU] via INTRAVENOUS

## 2022-09-24 MED ORDER — OXYCODONE HCL 5 MG/5ML PO SOLN: 5.0000 mg | Freq: Once | ORAL | Status: DC | PRN

## 2022-09-24 MED ORDER — 0.9 % SODIUM CHLORIDE (POUR BTL) OPTIME
TOPICAL | Status: DC | PRN
  Administered 2022-09-24: 100 mL

## 2022-09-24 MED ORDER — PHENYLEPHRINE 80 MCG/ML (10ML) SYRINGE FOR IV PUSH (FOR BLOOD PRESSURE SUPPORT)
PREFILLED_SYRINGE | INTRAVENOUS | Status: DC | PRN
  Administered 2022-09-24 (×4): 160 ug via INTRAVENOUS

## 2022-09-24 MED ORDER — VASOPRESSIN 20 UNIT/ML IV SOLN
INTRAVENOUS | Status: AC
  Filled 2022-09-24: qty 1

## 2022-09-24 MED ORDER — ONDANSETRON HCL 4 MG/2ML IJ SOLN
INTRAMUSCULAR | Status: DC | PRN
  Administered 2022-09-24: 4 mg via INTRAVENOUS

## 2022-09-24 MED ORDER — PHENYLEPHRINE HCL (PRESSORS) 10 MG/ML IV SOLN
INTRAVENOUS | Status: AC
  Filled 2022-09-24: qty 1

## 2022-09-24 MED ORDER — PROPOFOL 10 MG/ML IV BOLUS
INTRAVENOUS | Status: AC
  Filled 2022-09-24: qty 20

## 2022-09-24 MED ORDER — ACETAMINOPHEN 500 MG PO TABS
1000.0000 mg | ORAL_TABLET | Freq: Once | ORAL | Status: AC
  Administered 2022-09-24: 1000 mg via ORAL

## 2022-09-24 MED ORDER — DEXAMETHASONE SODIUM PHOSPHATE 10 MG/ML IJ SOLN
INTRAMUSCULAR | Status: DC | PRN
  Administered 2022-09-24: 5 mg via INTRAVENOUS

## 2022-09-24 MED ORDER — FENTANYL CITRATE (PF) 100 MCG/2ML IJ SOLN
100.0000 ug | Freq: Once | INTRAMUSCULAR | Status: AC
  Administered 2022-09-24: 100 ug via INTRAVENOUS

## 2022-09-24 MED ORDER — DEXAMETHASONE SODIUM PHOSPHATE 10 MG/ML IJ SOLN
INTRAMUSCULAR | Status: DC | PRN
  Administered 2022-09-24: 10 mg

## 2022-09-24 MED ORDER — OXYCODONE HCL 5 MG PO TABS: 5.0000 mg | ORAL_TABLET | Freq: Once | ORAL | Status: DC | PRN

## 2022-09-24 MED ORDER — OXYCODONE HCL 5 MG PO TABS: 5.0000 mg | ORAL_TABLET | Freq: Four times a day (QID) | ORAL | 0 refills | Status: AC | PRN

## 2022-09-24 MED ORDER — LACTATED RINGERS IV SOLN: INTRAVENOUS | Status: DC

## 2022-09-24 MED ORDER — CEFAZOLIN SODIUM-DEXTROSE 2-4 GM/100ML-% IV SOLN
INTRAVENOUS | Status: AC
  Filled 2022-09-24: qty 100

## 2022-09-24 MED ORDER — HYDROMORPHONE HCL 1 MG/ML IJ SOLN: 0.2500 mg | INTRAMUSCULAR | Status: DC | PRN

## 2022-09-24 MED ORDER — PHENYLEPHRINE HCL-NACL 20-0.9 MG/250ML-% IV SOLN
INTRAVENOUS | Status: DC | PRN
  Administered 2022-09-24: 40 ug/min via INTRAVENOUS

## 2022-09-24 MED ORDER — CEFAZOLIN SODIUM-DEXTROSE 2-4 GM/100ML-% IV SOLN
2.0000 g | INTRAVENOUS | Status: AC
  Administered 2022-09-24: 2 g via INTRAVENOUS

## 2022-09-24 MED ORDER — PROPOFOL 10 MG/ML IV BOLUS
INTRAVENOUS | Status: DC | PRN
  Administered 2022-09-24: 160 mg via INTRAVENOUS

## 2022-09-24 MED ORDER — ROPIVACAINE HCL 5 MG/ML IJ SOLN
INTRAMUSCULAR | Status: DC | PRN
Start: 2022-09-24 — End: 2022-09-24
  Administered 2022-09-24: 40 mL via PERINEURAL

## 2022-09-24 MED ORDER — ACETAMINOPHEN 500 MG PO TABS
ORAL_TABLET | ORAL | Status: AC
  Filled 2022-09-24: qty 2

## 2022-09-24 SURGICAL SUPPLY — 74 items
APL PRP STRL LF DISP 70% ISPRP (MISCELLANEOUS) ×1
BANDAGE ESMARK 6X9 LF (GAUZE/BANDAGES/DRESSINGS) IMPLANT
BIT DRILL 3 CANN STRGHT (BIT) ×1
BLADE AVERAGE 25X9 (BLADE) IMPLANT
BLADE LONG MED 25X9 (BLADE) ×1 IMPLANT
BLADE MINI RND TIP GREEN BEAV (BLADE) ×1 IMPLANT
BLADE OSC/SAG .038X5.5 CUT EDG (BLADE) IMPLANT
BLADE SURG 15 STRL LF DISP TIS (BLADE) ×2 IMPLANT
BLADE SURG 15 STRL SS (BLADE) ×2
BNDG CMPR 5X4 KNIT ELC UNQ LF (GAUZE/BANDAGES/DRESSINGS) ×1
BNDG CMPR 75X21 PLY HI ABS (MISCELLANEOUS)
BNDG CMPR 9X4 STRL LF SNTH (GAUZE/BANDAGES/DRESSINGS)
BNDG CMPR 9X6 STRL LF SNTH (GAUZE/BANDAGES/DRESSINGS)
BNDG ELASTIC 4INX 5YD STR LF (GAUZE/BANDAGES/DRESSINGS) ×1 IMPLANT
BNDG ESMARK 4X9 LF (GAUZE/BANDAGES/DRESSINGS) IMPLANT
BNDG ESMARK 6X9 LF (GAUZE/BANDAGES/DRESSINGS)
BNDG GZE 12X3 1 PLY HI ABS (GAUZE/BANDAGES/DRESSINGS) ×1
BNDG STRETCH GAUZE 3IN X12FT (GAUZE/BANDAGES/DRESSINGS) ×1 IMPLANT
BUR MIS STRT 2.0X13 (BURR) IMPLANT
BURR MIS STRT 2.0X13 (BURR) ×1
CAP PIN PROTECTOR ORTHO WHT (CAP) IMPLANT
CHLORAPREP W/TINT 26 (MISCELLANEOUS) ×1 IMPLANT
COVER BACK TABLE 60X90IN (DRAPES) ×1 IMPLANT
CUFF TOURN SGL QUICK 34 (TOURNIQUET CUFF)
CUFF TRNQT CYL 34X4.125X (TOURNIQUET CUFF) IMPLANT
DRAPE EXTREMITY T 121X128X90 (DISPOSABLE) ×1 IMPLANT
DRAPE OEC MINIVIEW 54X84 (DRAPES) ×1 IMPLANT
DRAPE U-SHAPE 47X51 STRL (DRAPES) ×1 IMPLANT
DRILL COUNTERSINK CANN 3 (BIT) IMPLANT
DRSG MEPITEL 4X7.2 (GAUZE/BANDAGES/DRESSINGS) ×1 IMPLANT
ELECT REM PT RETURN 9FT ADLT (ELECTROSURGICAL) ×1
ELECTRODE REM PT RTRN 9FT ADLT (ELECTROSURGICAL) ×1 IMPLANT
GAUZE PAD ABD 8X10 STRL (GAUZE/BANDAGES/DRESSINGS) ×1 IMPLANT
GAUZE SPONGE 4X4 12PLY STRL (GAUZE/BANDAGES/DRESSINGS) ×1 IMPLANT
GAUZE STRETCH 2X75IN STRL (MISCELLANEOUS) IMPLANT
GLOVE BIO SURGEON STRL SZ8 (GLOVE) ×1 IMPLANT
GLOVE BIOGEL PI IND STRL 8 (GLOVE) ×2 IMPLANT
GLOVE ECLIPSE 8.0 STRL XLNG CF (GLOVE) ×1 IMPLANT
GOWN STRL REUS W/ TWL LRG LVL3 (GOWN DISPOSABLE) ×1 IMPLANT
GOWN STRL REUS W/ TWL XL LVL3 (GOWN DISPOSABLE) ×2 IMPLANT
GOWN STRL REUS W/TWL LRG LVL3 (GOWN DISPOSABLE) ×1
GOWN STRL REUS W/TWL XL LVL3 (GOWN DISPOSABLE) ×2
GUIDEWIRE 0.86MM (WIRE) IMPLANT
K-WIRE DBL .054X4 NSTRL (WIRE)
KWIRE DBL .054X4 NSTRL (WIRE) IMPLANT
NDL HYPO 22X1.5 SAFETY MO (MISCELLANEOUS) IMPLANT
NEEDLE HYPO 22X1.5 SAFETY MO (MISCELLANEOUS)
NS IRRIG 1000ML POUR BTL (IV SOLUTION) ×1 IMPLANT
PACK BASIN DAY SURGERY FS (CUSTOM PROCEDURE TRAY) ×1 IMPLANT
PAD CAST 4YDX4 CTTN HI CHSV (CAST SUPPLIES) ×1 IMPLANT
PADDING CAST ABS COTTON 4X4 ST (CAST SUPPLIES) IMPLANT
PADDING CAST COTTON 4X4 STRL (CAST SUPPLIES) ×1
PASSER SUT SWANSON 36MM LOOP (INSTRUMENTS) IMPLANT
PENCIL SMOKE EVACUATOR (MISCELLANEOUS) ×1 IMPLANT
SANITIZER HAND PURELL FF 515ML (MISCELLANEOUS) ×1 IMPLANT
SCREW COMPRESSION 2.5X18MM (Screw) IMPLANT
SCREW HEADLESS 2.5X16 (Screw) IMPLANT
SET IRRIGATION TUBING (TUBING) IMPLANT
SHEET MEDIUM DRAPE 40X70 STRL (DRAPES) ×1 IMPLANT
SLEEVE SCD COMPRESS KNEE MED (STOCKING) ×1 IMPLANT
SPONGE T-LAP 18X18 ~~LOC~~+RFID (SPONGE) ×1 IMPLANT
STOCKINETTE 6 STRL (DRAPES) ×1 IMPLANT
SUCTION TUBE FRAZIER 10FR DISP (SUCTIONS) ×1 IMPLANT
SUT ETHILON 3 0 PS 1 (SUTURE) ×1 IMPLANT
SUT MNCRL AB 3-0 PS2 18 (SUTURE) ×1 IMPLANT
SUT VIC AB 2-0 SH 27 (SUTURE)
SUT VIC AB 2-0 SH 27XBRD (SUTURE) IMPLANT
SUT VICRYL 0 UR6 27IN ABS (SUTURE) IMPLANT
SYR BULB EAR ULCER 3OZ GRN STR (SYRINGE) ×1 IMPLANT
SYR CONTROL 10ML LL (SYRINGE) IMPLANT
TOWEL GREEN STERILE FF (TOWEL DISPOSABLE) ×1 IMPLANT
TUBE CONNECTING 20X1/4 (TUBING) ×1 IMPLANT
UNDERPAD 30X36 HEAVY ABSORB (UNDERPADS AND DIAPERS) ×1 IMPLANT
YANKAUER SUCT BULB TIP NO VENT (SUCTIONS) IMPLANT

## 2022-09-24 NOTE — Anesthesia Procedure Notes (Signed)
Anesthesia Regional Block: Popliteal block   Pre-Anesthetic Checklist: , timeout performed,  Correct Patient, Correct Site, Correct Laterality,  Correct Procedure, Correct Position, site marked,  Risks and benefits discussed,  Surgical consent,  Pre-op evaluation,  At surgeon's request and post-op pain management  Laterality: Left  Prep: Maximum Sterile Barrier Precautions used, chloraprep       Needles:  Injection technique: Single-shot  Needle Type: Echogenic Stimulator Needle     Needle Length: 9cm  Needle Gauge: 22     Additional Needles:   Procedures:,,,, ultrasound used (permanent image in chart),,    Narrative:  Start time: 09/24/2022 7:00 AM End time: 09/24/2022 7:05 AM Injection made incrementally with aspirations every 5 mL.  Performed by: Personally  Anesthesiologist: Lannie Fields, DO  Additional Notes: Monitors applied. No increased pain on injection. No increased resistance to injection. Injection made in 5cc increments. Good needle visualization. Patient tolerated procedure well.

## 2022-09-24 NOTE — Anesthesia Procedure Notes (Signed)
Anesthesia Regional Block: Adductor canal block   Pre-Anesthetic Checklist: , timeout performed,  Correct Patient, Correct Site, Correct Laterality,  Correct Procedure, Correct Position, site marked,  Risks and benefits discussed,  Surgical consent,  Pre-op evaluation,  At surgeon's request and post-op pain management  Laterality: Left  Prep: Maximum Sterile Barrier Precautions used, chloraprep       Needles:  Injection technique: Single-shot  Needle Type: Echogenic Stimulator Needle     Needle Length: 9cm  Needle Gauge: 22     Additional Needles:   Procedures:,,,, ultrasound used (permanent image in chart),,    Narrative:  Start time: 09/24/2022 7:05 AM End time: 09/24/2022 7:10 AM Injection made incrementally with aspirations every 5 mL.  Performed by: Personally  Anesthesiologist: Lisa Fields, DO  Additional Notes: Monitors applied. No increased pain on injection. No increased resistance to injection. Injection made in 5cc increments. Good needle visualization. Patient tolerated procedure well.

## 2022-09-24 NOTE — Progress Notes (Signed)
Assisted Dr. Salvadore Farber with left, adductor canal, popliteal, ultrasound guided block. Side rails up, monitors on throughout procedure. See vital signs in flow sheet. Tolerated Procedure well.

## 2022-09-24 NOTE — Anesthesia Postprocedure Evaluation (Signed)
Anesthesia Post Note  Patient: Lisa French  Procedure(s) Performed: Left Second and Third Metatarsal Hammertoe Reconstruction with Weil Osteotomy (Left: Toe)     Patient location during evaluation: PACU Anesthesia Type: Regional and General Level of consciousness: awake and alert, oriented and patient cooperative Pain management: pain level controlled Vital Signs Assessment: post-procedure vital signs reviewed and stable Respiratory status: spontaneous breathing, nonlabored ventilation and respiratory function stable Cardiovascular status: blood pressure returned to baseline and stable Postop Assessment: no apparent nausea or vomiting Anesthetic complications: no   No notable events documented.  Last Vitals:  Vitals:   09/24/22 0830 09/24/22 0845  BP: 99/64 102/68  Pulse: 72 65  Resp: (!) 9 10  Temp:    SpO2: 99% 99%    Last Pain:  Vitals:   09/24/22 0629  TempSrc: Temporal  PainSc: 0-No pain                 Lannie Fields

## 2022-09-24 NOTE — Anesthesia Preprocedure Evaluation (Addendum)
Anesthesia Evaluation  Patient identified by MRN, date of birth, ID band Patient awake    Reviewed: Allergy & Precautions, H&P , NPO status , Patient's Chart, lab work & pertinent test results  History of Anesthesia Complications (+) Emergence Delirium and history of anesthetic complications  Airway Mallampati: III  TM Distance: >3 FB Neck ROM: Full    Dental  (+) Teeth Intact, Dental Advisory Given   Pulmonary neg pulmonary ROS   Pulmonary exam normal breath sounds clear to auscultation       Cardiovascular hypertension (95/71 preop), Pt. on medications Normal cardiovascular exam Rhythm:Regular Rate:Normal     Neuro/Psych negative neurological ROS  negative psych ROS   GI/Hepatic Neg liver ROS,GERD  Controlled and Medicated,,  Endo/Other  negative endocrine ROS    Renal/GU negative Renal ROS  negative genitourinary   Musculoskeletal  (+) Arthritis , Osteoarthritis,    Abdominal   Peds negative pediatric ROS (+)  Hematology negative hematology ROS (+)   Anesthesia Other Findings   Reproductive/Obstetrics negative OB ROS                             Anesthesia Physical Anesthesia Plan  ASA: 2  Anesthesia Plan: General and Regional   Post-op Pain Management: Regional block* and Tylenol PO (pre-op)*   Induction: Intravenous  PONV Risk Score and Plan: 3 and Ondansetron, Dexamethasone and Treatment may vary due to age or medical condition  Airway Management Planned: LMA  Additional Equipment: None  Intra-op Plan:   Post-operative Plan: Extubation in OR  Informed Consent: I have reviewed the patients History and Physical, chart, labs and discussed the procedure including the risks, benefits and alternatives for the proposed anesthesia with the patient or authorized representative who has indicated his/her understanding and acceptance.     Dental advisory given  Plan  Discussed with: CRNA  Anesthesia Plan Comments:         Anesthesia Quick Evaluation

## 2022-09-24 NOTE — Discharge Instructions (Addendum)
Toni Arthurs, MD [facility]  Please read the following information regarding your care after surgery.  Medications  You only need a prescription for the narcotic pain medicine (ex. oxycodone, Percocet, Norco).  All of the other medicines listed below are available over the counter. ? Aleve 2 pills twice a day for the first 3 days after surgery. ? acetominophen (Tylenol) 650 mg every 4-6 hours as you need for minor to moderate pain ? oxycodone as prescribed for severe pain  Narcotic pain medicine (ex. oxycodone, Percocet, Vicodin) will cause constipation.  To prevent this problem, take the following medicines while you are taking any pain medicine. ? docusate sodium (Colace) 100 mg twice a day ? senna (Senokot) 2 tablets twice a day  Weight Bearing ? Bear weight only on your operated foot in the post-op shoe.   Cast / Splint / Dressing ? Keep your splint, cast or dressing clean and dry.  Don't put anything (coat hanger, pencil, etc) down inside of it.  If it gets damp, use a hair dryer on the cool setting to dry it.  If it gets soaked, call the office to schedule an appointment for a cast change.   After your dressing, cast or splint is removed; you may shower, but do not soak or scrub the wound.  Allow the water to run over it, and then gently pat it dry.  Swelling It is normal for you to have swelling where you had surgery.  To reduce swelling and pain, keep your toes above your nose for at least 3 days after surgery.  It may be necessary to keep your foot or leg elevated for several weeks.  If it hurts, it should be elevated.  Follow Up Call my office at [phone] when you are discharged from the hospital or surgery center to schedule an appointment to be seen two weeks after surgery.  Call my office at [phone] if you develop a fever >101.5 F, nausea, vomiting, bleeding from the surgical site or severe pain.    Post Anesthesia Home Care Instructions  Activity: Get  plenty of rest for the remainder of the day. A responsible individual must stay with you for 24 hours following the procedure.  For the next 24 hours, DO NOT: -Drive a car -Advertising copywriter -Drink alcoholic beverages -Take any medication unless instructed by your physician -Make any legal decisions or sign important papers.  Meals: Start with liquid foods such as gelatin or soup. Progress to regular foods as tolerated. Avoid greasy, spicy, heavy foods. If nausea and/or vomiting occur, drink only clear liquids until the nausea and/or vomiting subsides. Call your physician if vomiting continues.  Special Instructions/Symptoms: Your throat may feel dry or sore from the anesthesia or the breathing tube placed in your throat during surgery. If this causes discomfort, gargle with warm salt water. The discomfort should disappear within 24 hours.  If you had a scopolamine patch placed behind your ear for the management of post- operative nausea and/or vomiting:  1. The medication in the patch is effective for 72 hours, after which it should be removed.  Wrap patch in a tissue and discard in the trash. Wash hands thoroughly with soap and water. 2. You may remove the patch earlier than 72 hours if you experience unpleasant side effects which may include dry mouth, dizziness or visual disturbances. 3. Avoid touching the patch. Wash your hands with soap and water after contact with the patch.  Regional Anesthesia Blocks  1. You may not  be able to move or feel the "blocked" extremity after a regional anesthetic block. This may last may last from 3-48 hours after placement, but it will go away. The length of time depends on the medication injected and your individual response to the medication. As the nerves start to wake up, you may experience tingling as the movement and feeling returns to your extremity. If the numbness and inability to move your extremity has not gone away after 48 hours, please call  your surgeon.   2. The extremity that is blocked will need to be protected until the numbness is gone and the strength has returned. Because you cannot feel it, you will need to take extra care to avoid injury. Because it may be weak, you may have difficulty moving it or using it. You may not know what position it is in without looking at it while the block is in effect.  3. For blocks in the legs and feet, returning to weight bearing and walking needs to be done carefully. You will need to wait until the numbness is entirely gone and the strength has returned. You should be able to move your leg and foot normally before you try and bear weight or walk. You will need someone to be with you when you first try to ensure you do not fall and possibly risk injury.  4. Bruising and tenderness at the needle site are common side effects and will resolve in a few days.  5. Persistent numbness or new problems with movement should be communicated to the surgeon or the [facility] [phone] [facility] [phone]).  No tylenol until after 1248pm today if needed.

## 2022-09-24 NOTE — Transfer of Care (Signed)
Immediate Anesthesia Transfer of Care Note  Patient: Lisa French  Procedure(s) Performed: Left Second and Third Metatarsal Hammertoe Reconstruction with Weil Osteotomy (Left: Toe)  Patient Location: PACU  Anesthesia Type:GA combined with regional for post-op pain  Level of Consciousness: drowsy  Airway & Oxygen Therapy: Patient Spontanous Breathing and Patient connected to nasal cannula oxygen  Post-op Assessment: Report given to RN and Post -op Vital signs reviewed and stable  Post vital signs: Reviewed and stable  Last Vitals:  Vitals Value Taken Time  BP 97/63 09/24/22 0826  Temp    Pulse 74 09/24/22 0828  Resp 13 09/24/22 0828  SpO2 98 % 09/24/22 0828  Vitals shown include unfiled device data.  Last Pain:  Vitals:   09/24/22 0629  TempSrc: Temporal  PainSc: 0-No pain      Patients Stated Pain Goal: 3 (09/24/22 5366)  Complications: No notable events documented.

## 2022-09-24 NOTE — Op Note (Signed)
09/24/2022  8:30 AM  PATIENT:  Lisa French  72 y.o. female  PRE-OPERATIVE DIAGNOSIS: 1.  Left forefoot metatarsalgia 2.  Left second and third hammertoe deformities with MTP joint instability  POST-OPERATIVE DIAGNOSIS: Same  Procedure(s): 1.  Left second and third ray extensor digitorum longus and brevis tendon lengthening 2.  Left second and third metatarsal Weil osteotomies 3.  Left second and third hammertoe corrections 4.  Left foot AP and oblique radiographs  SURGEON:  Toni Arthurs, MD  ASSISTANT: Alfredo Martinez, PA-C  ANESTHESIA:   General, regional  EBL:  minimal   TOURNIQUET: None  COMPLICATIONS:  None apparent  DISPOSITION:  Extubated, awake and stable to recovery.  INDICATION FOR PROCEDURE: 72 year old female without significant past medical history complains of left second and third hammertoe deformities with metatarsalgia and MTP joint instability.  She has failed nonoperative treatment and presents today for surgical correction.  The risks and benefits of the alternative treatment options have been discussed in detail.  The patient wishes to proceed with surgery and specifically understands risks of bleeding, infection, nerve damage, blood clots, need for additional surgery, amputation and death.   PROCEDURE IN DETAIL:  After pre operative consent was obtained, and the correct operative site was identified, the patient was brought to the operating room and placed supine on the OR table.  Anesthesia was administered.  Pre-operative antibiotics were administered.  A surgical timeout was taken.  The left lower extremity was prepped and draped in standard sterile fashion.  The contracted extensor tendons were identified at the dorsum of the forefoot at the second and third rays.  A Beaver blade was inserted percutaneously adjacent to the EDB and EDL tendons of the second ray.  They were carefully cut with the knife blade taking care to avoid the skin overlying the tendons.   Same procedure was then performed for the third ray.  The toes could then be plantarflexed past neutral.  An AP radiograph was obtained.  An incision was made adjacent to the second metatarsal neck.  Subperiosteal dissection was carried on the plantar surface of the neck.  The 2 x 8 mm Shannon bur was inserted plantar to the metatarsal.  An oblique osteotomy was made from plantar proximal to dorsal distal.  Completion of the osteotomy was confirmed on fluoroscopy.  The same procedure was then performed for the third ray.  Attention was turned to the second toe PIP joint.  A mid lateral incision was made adjacent to the PIP joint.  The 2 x 8 mm bur was inserted into the joint.  The remaining subchondral bone and cartilage was taken down with the bur.  The joint was reduced.  A guidewire was inserted across both IP joints.  The guidewire was overdrilled.  A 2.5 mm Arthrex headless compression screw was inserted.  It was noted to compress the PIP joint appropriately.  The same procedure was then performed for the third toe.  The wounds were irrigated and closed with 3-0 Monocryl.  A bolster was placed between the hallux and second toe.  The toes were wrapped with moistened gauze to pull them in line with the fourth and fifth toes.  Sterile dressings were applied followed by compression wrap.  The patient was awakened from anesthesia and transported to the recovery room in stable condition.  FOLLOW UP PLAN: Weightbearing as tolerated in a flat postop shoe.  Follow-up in the office in 2 weeks for wound check toe spacer and repeat toe taping.  Plan 6 weeks of postoperative toe spacer and taping along with a flat postop shoe.   RADIOGRAPHS: AP and oblique radiographs of the left foot are obtained intraoperatively.  These show second and third metatarsal osteotomies with angular correction as well as hammertoe corrections with screws across both PIP joints.  Hardware is appropriately positioned and of the  appropriate lengths.  No other acute injuries are noted.    Alfredo Martinez PA-C was present and scrubbed for the duration of the operative case. His assistance was essential in positioning the patient, prepping and draping, gaining and maintaining exposure, performing the operation, closing and dressing the wounds and applying the splint.

## 2022-09-24 NOTE — Anesthesia Procedure Notes (Signed)
Procedure Name: LMA Insertion Date/Time: 09/24/2022 7:39 AM  Performed by: Elliot Dally, CRNAPre-anesthesia Checklist: Patient identified, Emergency Drugs available, Suction available and Patient being monitored Patient Re-evaluated:Patient Re-evaluated prior to induction Oxygen Delivery Method: Circle System Utilized Preoxygenation: Pre-oxygenation with 100% oxygen Induction Type: IV induction Ventilation: Mask ventilation without difficulty LMA: LMA inserted LMA Size: 4.0 Number of attempts: 1 Airway Equipment and Method: Bite block Placement Confirmation: positive ETCO2 Tube secured with: Tape Dental Injury: Teeth and Oropharynx as per pre-operative assessment

## 2022-09-28 ENCOUNTER — Encounter (HOSPITAL_BASED_OUTPATIENT_CLINIC_OR_DEPARTMENT_OTHER): Payer: Self-pay | Admitting: Orthopedic Surgery

## 2022-10-09 DIAGNOSIS — Z20828 Contact with and (suspected) exposure to other viral communicable diseases: Secondary | ICD-10-CM | POA: Diagnosis not present

## 2022-10-09 DIAGNOSIS — R030 Elevated blood-pressure reading, without diagnosis of hypertension: Secondary | ICD-10-CM | POA: Diagnosis not present

## 2022-10-09 DIAGNOSIS — R197 Diarrhea, unspecified: Secondary | ICD-10-CM | POA: Diagnosis not present

## 2022-10-09 DIAGNOSIS — R5383 Other fatigue: Secondary | ICD-10-CM | POA: Diagnosis not present

## 2022-10-09 DIAGNOSIS — Z6824 Body mass index (BMI) 24.0-24.9, adult: Secondary | ICD-10-CM | POA: Diagnosis not present

## 2022-10-09 DIAGNOSIS — R509 Fever, unspecified: Secondary | ICD-10-CM | POA: Diagnosis not present

## 2022-11-04 DIAGNOSIS — M7742 Metatarsalgia, left foot: Secondary | ICD-10-CM | POA: Diagnosis not present

## 2022-11-04 DIAGNOSIS — Z4889 Encounter for other specified surgical aftercare: Secondary | ICD-10-CM | POA: Diagnosis not present

## 2022-12-10 DIAGNOSIS — Z4889 Encounter for other specified surgical aftercare: Secondary | ICD-10-CM | POA: Diagnosis not present

## 2022-12-10 DIAGNOSIS — M7742 Metatarsalgia, left foot: Secondary | ICD-10-CM | POA: Diagnosis not present

## 2022-12-22 DIAGNOSIS — I129 Hypertensive chronic kidney disease with stage 1 through stage 4 chronic kidney disease, or unspecified chronic kidney disease: Secondary | ICD-10-CM | POA: Diagnosis not present

## 2022-12-22 DIAGNOSIS — N183 Chronic kidney disease, stage 3 unspecified: Secondary | ICD-10-CM | POA: Diagnosis not present

## 2022-12-24 DIAGNOSIS — R8271 Bacteriuria: Secondary | ICD-10-CM | POA: Diagnosis not present

## 2022-12-24 DIAGNOSIS — E559 Vitamin D deficiency, unspecified: Secondary | ICD-10-CM | POA: Diagnosis not present

## 2022-12-24 DIAGNOSIS — N183 Chronic kidney disease, stage 3 unspecified: Secondary | ICD-10-CM | POA: Diagnosis not present

## 2022-12-24 DIAGNOSIS — I129 Hypertensive chronic kidney disease with stage 1 through stage 4 chronic kidney disease, or unspecified chronic kidney disease: Secondary | ICD-10-CM | POA: Diagnosis not present

## 2022-12-24 DIAGNOSIS — M898X9 Other specified disorders of bone, unspecified site: Secondary | ICD-10-CM | POA: Diagnosis not present

## 2022-12-24 NOTE — Progress Notes (Signed)
 Subjective:  Lisa French is a 72 y.o female with CKD stage 3/Presumed interstitial nephritis,longstanding hypertension/hyperlipidemia,Recurrent UTIs, GERD, irritable bowel syndrome with predominant diarrhea, arthritis status post right hip arthroplasty 10/2011, migraines, umbilical hernia repair with mesh 12/2017 here for 1 year follow up. She lives in [place]. PCP is Dr. Jerel Sieving.  CKD dates back per documentation to 2018-creatinine range 1.4 to 1.5 with eGFR~35 mL/min. Most recent BUN/creatinine stable and within baseline range 15/1.4, GFR~40 mL/min.  Her electrolytes are within normal limits. PTH 35, vitamin D 46.  [place] vitamin D supplements [place]. Urinalysis pyuria, microscopy with few bacteria.  Denies having any symptoms of UTI. BP well controlled.  Denies any uremic symptoms.Has nocturia and increased urgency that has been going on for years. Had left second and third hammertoe deformities repaired 09/2022.  Review of Systems A complete ROS was performed with pertinent positives and negatives noted in the HPI. The remainder of the ROS are negative.  Meds Ordered in [place]  Medication Sig Dispense Refill  . amoxicillin (AMOXIL) 500 mg tablet TAKE 4 TABLETS PRIOR TO DENTAL PROCEDURES    . losartan (COZAAR) 100 mg tablet Take 100 mg by mouth Once Daily.    . pantoprazole (PROTONIX) 40 mg EC tablet Take 40 mg by mouth Once Daily.    . simvastatin (ZOCOR) 10 mg tablet Take 10 mg by mouth Once Daily.    . triamcinolone  (KENALOG ) 0.1 % cream APPLY TOPICALLY TO THE AFFECTED AREA THREE TIMES DAILY AS NEEDED     No current Epic-ordered facility-administered medications on file.      Objective:     Vitals:   12/24/22 1316  BP: 108/74  Patient Position: Sitting  Pulse: 70  Weight: 71.2 kg (157 lb)   Wt Readings from Last 3 Encounters:  12/24/22 71.2 kg (157 lb)  12/18/21 70.3 kg (155 lb)  10/02/20 71.2 kg (157 lb)     Physical  Exam: General appearance: alert, appears stated age, cooperative, and no distress Head: normocephalic, atraumatic Eyes: Perrla, EOMI Lungs: Ctab Heart: S1S2 present,regular rate and rhythm, no murmurs Abdomen: is flat,soft, no tenderness, bs present Extremities: extremities normal, atraumatic, no cyanosis or edema Skin: Skin color, texture, turgor normal. No rashes or lesions Neurologic: alert and oriented times 3,CN I-XII intact, no neurologic deficits   Results for orders placed or performed in visit on 12/18/21  Vitamin D, 25-Hydroxy   Collection Time: 12/11/21 12:00 AM  Result Value Ref Range   HX VITAMIN D 25-HYDROXY ([facility]) 38.2   POC Urinalysis without Microscopic Request   Collection Time: 12/18/21  2:18 PM  Result Value Ref Range   HX POC URINE COLOR YELLOW Yellow   HX POC URINE APPEARANCE CLEAR Clear   HX POC URINE GLUCOSE Negative Negative mg/dL   HX POC URINE BILIRUBIN Negative Negative   HX POC URINE KETONES Negative Negative mg/dL   HX POC URINE SPECIFIC GRAVITY <=1.005 1.005 - 1.025   HX POC URINE BLOOD Negative Negative   HX POC URINE PH DIPSTICK 6.0 4.6 - 8.0   HX POC URINE PROTEIN Negative Negative mg/dL   HX POC UROBILINOGEN 0.2 0.2 - 1 mg/dL   HX POC URINE NITRITE Negative Negative   HX POC URINE LEUKOCYTES Trace (A) Negative   HX POC URINE STRIP LOT NUMBER [id number]    HX EXPIRATION DATE 11/05/2022        Assessment:   1. Benign hypertension with chronic kidney disease, stage  III (HCC)   2. Vitamin D deficiency   3. Metabolic bone disease   4. Asymptomatic bacteriuria      Plan:  -Creatinine stable and within baseline range. -Asymptomatic bacteriuria. -Phos, bicarb, PTH, vitamin D, hemoglobin at goal for CKD. -Blood pressure low normal at baseline, asymptomatic. -Return to clinic in 1 year or PRN.  No orders of the defined types were placed in this encounter.  Orders Placed This Encounter  Procedures  . Vitamin D, 25-Hydroxy    Standing  Status:   Future    Expected Date:   12/24/2023    Expiration Date:   01/28/2024    Release to patient::   Immediate  . Renal Function Panel    Standing Status:   Future    Expected Date:   12/16/2023    Expiration Date:   01/28/2024    Release to patient::   Immediate  . Parathyroid Hormone (PTH), Intact    Standing Status:   Future    Expected Date:   12/14/2023    Expiration Date:   01/28/2024    Release to patient::   Immediate  . Urinalysis with Microscopic    Standing Status:   Future    Expected Date:   12/14/2023    Expiration Date:   01/28/2024    Release to patient::   Immediate    ADETOYE  [email or web address], MD, FASN 12/24/2022 1:33 PM

## 2023-01-21 DIAGNOSIS — M7742 Metatarsalgia, left foot: Secondary | ICD-10-CM | POA: Diagnosis not present

## 2023-02-16 DIAGNOSIS — M546 Pain in thoracic spine: Secondary | ICD-10-CM | POA: Diagnosis not present

## 2023-02-16 DIAGNOSIS — E663 Overweight: Secondary | ICD-10-CM | POA: Diagnosis not present

## 2023-02-16 DIAGNOSIS — Z6825 Body mass index (BMI) 25.0-25.9, adult: Secondary | ICD-10-CM | POA: Diagnosis not present

## 2023-02-20 DIAGNOSIS — F331 Major depressive disorder, recurrent, moderate: Secondary | ICD-10-CM | POA: Diagnosis not present

## 2023-02-20 DIAGNOSIS — Z6825 Body mass index (BMI) 25.0-25.9, adult: Secondary | ICD-10-CM | POA: Diagnosis not present

## 2023-02-20 DIAGNOSIS — M546 Pain in thoracic spine: Secondary | ICD-10-CM | POA: Diagnosis not present

## 2023-05-10 DIAGNOSIS — R2689 Other abnormalities of gait and mobility: Secondary | ICD-10-CM | POA: Diagnosis not present

## 2023-05-10 DIAGNOSIS — M7742 Metatarsalgia, left foot: Secondary | ICD-10-CM | POA: Diagnosis not present

## 2023-05-20 DIAGNOSIS — Z1231 Encounter for screening mammogram for malignant neoplasm of breast: Secondary | ICD-10-CM | POA: Diagnosis not present

## 2023-05-24 DIAGNOSIS — R269 Unspecified abnormalities of gait and mobility: Secondary | ICD-10-CM | POA: Diagnosis not present

## 2023-05-24 DIAGNOSIS — M6281 Muscle weakness (generalized): Secondary | ICD-10-CM | POA: Diagnosis not present

## 2023-05-26 DIAGNOSIS — R269 Unspecified abnormalities of gait and mobility: Secondary | ICD-10-CM | POA: Diagnosis not present

## 2023-05-26 DIAGNOSIS — B351 Tinea unguium: Secondary | ICD-10-CM | POA: Diagnosis not present

## 2023-05-26 DIAGNOSIS — Z6825 Body mass index (BMI) 25.0-25.9, adult: Secondary | ICD-10-CM | POA: Diagnosis not present

## 2023-05-26 DIAGNOSIS — M6281 Muscle weakness (generalized): Secondary | ICD-10-CM | POA: Diagnosis not present

## 2023-06-01 DIAGNOSIS — M6281 Muscle weakness (generalized): Secondary | ICD-10-CM | POA: Diagnosis not present

## 2023-06-01 DIAGNOSIS — R269 Unspecified abnormalities of gait and mobility: Secondary | ICD-10-CM | POA: Diagnosis not present

## 2023-06-03 DIAGNOSIS — M6281 Muscle weakness (generalized): Secondary | ICD-10-CM | POA: Diagnosis not present

## 2023-06-03 DIAGNOSIS — R269 Unspecified abnormalities of gait and mobility: Secondary | ICD-10-CM | POA: Diagnosis not present

## 2023-06-08 DIAGNOSIS — M6281 Muscle weakness (generalized): Secondary | ICD-10-CM | POA: Diagnosis not present

## 2023-06-08 DIAGNOSIS — R269 Unspecified abnormalities of gait and mobility: Secondary | ICD-10-CM | POA: Diagnosis not present

## 2023-06-10 DIAGNOSIS — K219 Gastro-esophageal reflux disease without esophagitis: Secondary | ICD-10-CM | POA: Diagnosis not present

## 2023-06-10 DIAGNOSIS — N1832 Chronic kidney disease, stage 3b: Secondary | ICD-10-CM | POA: Diagnosis not present

## 2023-06-10 DIAGNOSIS — E7849 Other hyperlipidemia: Secondary | ICD-10-CM | POA: Diagnosis not present

## 2023-06-10 DIAGNOSIS — R269 Unspecified abnormalities of gait and mobility: Secondary | ICD-10-CM | POA: Diagnosis not present

## 2023-06-10 DIAGNOSIS — M6281 Muscle weakness (generalized): Secondary | ICD-10-CM | POA: Diagnosis not present

## 2023-06-10 DIAGNOSIS — R5383 Other fatigue: Secondary | ICD-10-CM | POA: Diagnosis not present

## 2023-06-10 DIAGNOSIS — Z1329 Encounter for screening for other suspected endocrine disorder: Secondary | ICD-10-CM | POA: Diagnosis not present

## 2023-06-10 DIAGNOSIS — Z1321 Encounter for screening for nutritional disorder: Secondary | ICD-10-CM | POA: Diagnosis not present

## 2023-06-22 DIAGNOSIS — F331 Major depressive disorder, recurrent, moderate: Secondary | ICD-10-CM | POA: Diagnosis not present

## 2023-06-22 DIAGNOSIS — K589 Irritable bowel syndrome without diarrhea: Secondary | ICD-10-CM | POA: Diagnosis not present

## 2023-06-22 DIAGNOSIS — Z1331 Encounter for screening for depression: Secondary | ICD-10-CM | POA: Diagnosis not present

## 2023-06-22 DIAGNOSIS — E782 Mixed hyperlipidemia: Secondary | ICD-10-CM | POA: Diagnosis not present

## 2023-06-22 DIAGNOSIS — Z1389 Encounter for screening for other disorder: Secondary | ICD-10-CM | POA: Diagnosis not present

## 2023-06-22 DIAGNOSIS — N1832 Chronic kidney disease, stage 3b: Secondary | ICD-10-CM | POA: Diagnosis not present

## 2023-06-22 DIAGNOSIS — Z6826 Body mass index (BMI) 26.0-26.9, adult: Secondary | ICD-10-CM | POA: Diagnosis not present

## 2023-06-22 DIAGNOSIS — R269 Unspecified abnormalities of gait and mobility: Secondary | ICD-10-CM | POA: Diagnosis not present

## 2023-06-22 DIAGNOSIS — M6281 Muscle weakness (generalized): Secondary | ICD-10-CM | POA: Diagnosis not present

## 2023-06-22 DIAGNOSIS — Z0001 Encounter for general adult medical examination with abnormal findings: Secondary | ICD-10-CM | POA: Diagnosis not present

## 2023-06-24 DIAGNOSIS — M6281 Muscle weakness (generalized): Secondary | ICD-10-CM | POA: Diagnosis not present

## 2023-06-24 DIAGNOSIS — R269 Unspecified abnormalities of gait and mobility: Secondary | ICD-10-CM | POA: Diagnosis not present

## 2023-06-28 DIAGNOSIS — M6281 Muscle weakness (generalized): Secondary | ICD-10-CM | POA: Diagnosis not present

## 2023-06-28 DIAGNOSIS — R269 Unspecified abnormalities of gait and mobility: Secondary | ICD-10-CM | POA: Diagnosis not present

## 2023-06-30 ENCOUNTER — Encounter (INDEPENDENT_AMBULATORY_CARE_PROVIDER_SITE_OTHER): Payer: Self-pay | Admitting: *Deleted

## 2023-07-06 DIAGNOSIS — R269 Unspecified abnormalities of gait and mobility: Secondary | ICD-10-CM | POA: Diagnosis not present

## 2023-07-06 DIAGNOSIS — M6281 Muscle weakness (generalized): Secondary | ICD-10-CM | POA: Diagnosis not present

## 2023-07-12 DIAGNOSIS — R269 Unspecified abnormalities of gait and mobility: Secondary | ICD-10-CM | POA: Diagnosis not present

## 2023-07-12 DIAGNOSIS — M6281 Muscle weakness (generalized): Secondary | ICD-10-CM | POA: Diagnosis not present

## 2023-07-22 DIAGNOSIS — R269 Unspecified abnormalities of gait and mobility: Secondary | ICD-10-CM | POA: Diagnosis not present

## 2023-07-22 DIAGNOSIS — M6281 Muscle weakness (generalized): Secondary | ICD-10-CM | POA: Diagnosis not present

## 2023-07-26 DIAGNOSIS — R200 Anesthesia of skin: Secondary | ICD-10-CM | POA: Diagnosis not present

## 2023-07-26 DIAGNOSIS — M2042 Other hammer toe(s) (acquired), left foot: Secondary | ICD-10-CM | POA: Diagnosis not present

## 2023-08-23 DIAGNOSIS — M25562 Pain in left knee: Secondary | ICD-10-CM | POA: Diagnosis not present

## 2023-08-23 DIAGNOSIS — Z6825 Body mass index (BMI) 25.0-25.9, adult: Secondary | ICD-10-CM | POA: Diagnosis not present

## 2023-11-09 DIAGNOSIS — R21 Rash and other nonspecific skin eruption: Secondary | ICD-10-CM | POA: Diagnosis not present

## 2023-11-09 DIAGNOSIS — Z6826 Body mass index (BMI) 26.0-26.9, adult: Secondary | ICD-10-CM | POA: Diagnosis not present

## 2023-11-10 DIAGNOSIS — H01134 Eczematous dermatitis of left upper eyelid: Secondary | ICD-10-CM | POA: Diagnosis not present

## 2023-11-10 DIAGNOSIS — H01131 Eczematous dermatitis of right upper eyelid: Secondary | ICD-10-CM | POA: Diagnosis not present

## 2023-11-26 DIAGNOSIS — H04123 Dry eye syndrome of bilateral lacrimal glands: Secondary | ICD-10-CM | POA: Diagnosis not present

## 2023-11-26 DIAGNOSIS — H35363 Drusen (degenerative) of macula, bilateral: Secondary | ICD-10-CM | POA: Diagnosis not present

## 2023-11-26 DIAGNOSIS — B394 Histoplasmosis capsulati, unspecified: Secondary | ICD-10-CM | POA: Diagnosis not present

## 2024-01-04 ENCOUNTER — Encounter (INDEPENDENT_AMBULATORY_CARE_PROVIDER_SITE_OTHER): Payer: Self-pay | Admitting: *Deleted
